# Patient Record
Sex: Female | Born: 1960 | Race: White | Hispanic: No | Marital: Married | Smoking: Never smoker
Health system: Southern US, Community
[De-identification: ages and names within clinical notes are randomized; demographics above are authoritative.]

## PROBLEM LIST (undated history)

## (undated) DIAGNOSIS — N6009 Solitary cyst of unspecified breast: Secondary | ICD-10-CM

## (undated) DIAGNOSIS — N63 Unspecified lump in unspecified breast: Secondary | ICD-10-CM

## (undated) DIAGNOSIS — J302 Other seasonal allergic rhinitis: Secondary | ICD-10-CM

## (undated) DIAGNOSIS — M51379 Other intervertebral disc degeneration, lumbosacral region without mention of lumbar back pain or lower extremity pain: Secondary | ICD-10-CM

## (undated) DIAGNOSIS — N6019 Diffuse cystic mastopathy of unspecified breast: Secondary | ICD-10-CM

## (undated) DIAGNOSIS — N211 Calculus in urethra: Secondary | ICD-10-CM

## (undated) DIAGNOSIS — N3010 Interstitial cystitis (chronic) without hematuria: Secondary | ICD-10-CM

## (undated) DIAGNOSIS — A048 Other specified bacterial intestinal infections: Secondary | ICD-10-CM

## (undated) DIAGNOSIS — K120 Recurrent oral aphthae: Secondary | ICD-10-CM

## (undated) DIAGNOSIS — K3189 Other diseases of stomach and duodenum: Secondary | ICD-10-CM

## (undated) DIAGNOSIS — M1990 Unspecified osteoarthritis, unspecified site: Secondary | ICD-10-CM

## (undated) DIAGNOSIS — N83202 Unspecified ovarian cyst, left side: Secondary | ICD-10-CM

## (undated) DIAGNOSIS — M76899 Other specified enthesopathies of unspecified lower limb, excluding foot: Secondary | ICD-10-CM

## (undated) DIAGNOSIS — R1013 Epigastric pain: Secondary | ICD-10-CM

## (undated) DIAGNOSIS — K219 Gastro-esophageal reflux disease without esophagitis: Secondary | ICD-10-CM

## (undated) DIAGNOSIS — M5137 Other intervertebral disc degeneration, lumbosacral region: Secondary | ICD-10-CM

## (undated) HISTORY — DX: Solitary cyst of unspecified breast: N60.09

## (undated) HISTORY — DX: Other intervertebral disc degeneration, lumbosacral region: M51.37

## (undated) HISTORY — DX: Diffuse cystic mastopathy of unspecified breast: N60.19

## (undated) HISTORY — DX: Epigastric pain: R10.13

## (undated) HISTORY — PX: OTHER SURGICAL HISTORY: SHX169

## (undated) HISTORY — PX: EYE SURGERY: SHX253

## (undated) HISTORY — DX: Unspecified ovarian cyst, left side: N83.202

## (undated) HISTORY — DX: Other seasonal allergic rhinitis: J30.2

## (undated) HISTORY — PX: TUBAL LIGATION: SHX77

## (undated) HISTORY — DX: Unspecified osteoarthritis, unspecified site: M19.90

## (undated) HISTORY — DX: Interstitial cystitis (chronic) without hematuria: N30.10

## (undated) HISTORY — DX: Other specified bacterial intestinal infections: A04.8

## (undated) HISTORY — DX: Other intervertebral disc degeneration, lumbosacral region without mention of lumbar back pain or lower extremity pain: M51.379

## (undated) HISTORY — PX: CHOLECYSTECTOMY: SHX55

## (undated) HISTORY — DX: Unspecified lump in unspecified breast: N63.0

## (undated) HISTORY — DX: Calculus in urethra: N21.1

## (undated) HISTORY — DX: Recurrent oral aphthae: K12.0

## (undated) HISTORY — DX: Other specified enthesopathies of unspecified lower limb, excluding foot: M76.899

## (undated) HISTORY — DX: Gastro-esophageal reflux disease without esophagitis: K21.9

## (undated) HISTORY — PX: NASAL SEPTUM SURGERY: SHX37

## (undated) HISTORY — DX: Other diseases of stomach and duodenum: K31.89

---
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
## 1994-08-08 HISTORY — PX: BREAST BIOPSY: SHX20

## 1997-02-05 HISTORY — PX: LAPAROSCOPY: SHX197

## 1997-08-08 HISTORY — PX: ABDOMINAL HYSTERECTOMY: SHX81

## 1998-08-08 HISTORY — PX: UPPER GI ENDOSCOPY: SHX6162

## 2000-10-06 HISTORY — PX: BREAST CYST ASPIRATION: SHX578

## 2001-05-16 ENCOUNTER — Other Ambulatory Visit: Admission: RE | Admit: 2001-05-16 | Discharge: 2001-05-16 | Payer: Self-pay | Admitting: Family Medicine

## 2002-11-07 HISTORY — PX: OVARIAN CYST SURGERY: SHX726

## 2003-06-23 ENCOUNTER — Encounter: Payer: Self-pay | Admitting: Internal Medicine

## 2003-08-09 HISTORY — PX: URETHRAL DILATION: SUR417

## 2004-07-30 ENCOUNTER — Ambulatory Visit: Payer: Self-pay | Admitting: Family Medicine

## 2004-08-08 HISTORY — PX: OTHER SURGICAL HISTORY: SHX169

## 2004-08-13 ENCOUNTER — Ambulatory Visit: Payer: Self-pay | Admitting: Family Medicine

## 2004-11-08 ENCOUNTER — Ambulatory Visit (HOSPITAL_BASED_OUTPATIENT_CLINIC_OR_DEPARTMENT_OTHER): Admission: RE | Admit: 2004-11-08 | Discharge: 2004-11-08 | Payer: Self-pay | Admitting: Urology

## 2004-12-21 ENCOUNTER — Ambulatory Visit: Payer: Self-pay | Admitting: Family Medicine

## 2005-02-01 ENCOUNTER — Ambulatory Visit: Payer: Self-pay | Admitting: Family Medicine

## 2005-02-03 ENCOUNTER — Ambulatory Visit: Payer: Self-pay | Admitting: General Surgery

## 2005-02-17 ENCOUNTER — Ambulatory Visit: Payer: Self-pay | Admitting: Family Medicine

## 2005-03-04 ENCOUNTER — Ambulatory Visit: Payer: Self-pay | Admitting: Family Medicine

## 2005-04-05 ENCOUNTER — Ambulatory Visit: Payer: Self-pay | Admitting: Family Medicine

## 2005-08-05 ENCOUNTER — Ambulatory Visit: Payer: Self-pay | Admitting: Unknown Physician Specialty

## 2005-08-12 ENCOUNTER — Emergency Department: Payer: Self-pay | Admitting: Emergency Medicine

## 2005-12-14 ENCOUNTER — Ambulatory Visit: Payer: Self-pay | Admitting: General Surgery

## 2005-12-22 ENCOUNTER — Ambulatory Visit: Payer: Self-pay | Admitting: Family Medicine

## 2006-02-10 ENCOUNTER — Ambulatory Visit: Payer: Self-pay | Admitting: Family Medicine

## 2006-02-10 ENCOUNTER — Other Ambulatory Visit: Admission: RE | Admit: 2006-02-10 | Discharge: 2006-02-10 | Payer: Self-pay | Admitting: Family Medicine

## 2006-02-10 ENCOUNTER — Encounter: Payer: Self-pay | Admitting: Family Medicine

## 2006-02-10 LAB — CONVERTED CEMR LAB: Pap Smear: NORMAL

## 2006-05-04 ENCOUNTER — Ambulatory Visit: Payer: Self-pay | Admitting: Family Medicine

## 2006-09-13 ENCOUNTER — Ambulatory Visit: Payer: Self-pay | Admitting: Family Medicine

## 2006-10-09 ENCOUNTER — Ambulatory Visit: Payer: Self-pay | Admitting: Family Medicine

## 2006-11-22 ENCOUNTER — Ambulatory Visit: Payer: Self-pay | Admitting: Family Medicine

## 2006-12-15 ENCOUNTER — Ambulatory Visit: Payer: Self-pay | Admitting: General Surgery

## 2007-03-09 ENCOUNTER — Encounter: Payer: Self-pay | Admitting: Family Medicine

## 2007-03-09 DIAGNOSIS — M5137 Other intervertebral disc degeneration, lumbosacral region: Secondary | ICD-10-CM

## 2007-03-09 DIAGNOSIS — Z8619 Personal history of other infectious and parasitic diseases: Secondary | ICD-10-CM | POA: Insufficient documentation

## 2007-03-09 DIAGNOSIS — M76899 Other specified enthesopathies of unspecified lower limb, excluding foot: Secondary | ICD-10-CM | POA: Insufficient documentation

## 2007-03-09 DIAGNOSIS — K120 Recurrent oral aphthae: Secondary | ICD-10-CM

## 2007-03-09 DIAGNOSIS — R32 Unspecified urinary incontinence: Secondary | ICD-10-CM

## 2007-03-09 DIAGNOSIS — N211 Calculus in urethra: Secondary | ICD-10-CM

## 2007-03-09 DIAGNOSIS — K121 Other forms of stomatitis: Secondary | ICD-10-CM | POA: Insufficient documentation

## 2007-04-03 ENCOUNTER — Ambulatory Visit: Payer: Self-pay | Admitting: Family Medicine

## 2007-04-12 ENCOUNTER — Encounter: Payer: Self-pay | Admitting: Family Medicine

## 2007-04-26 ENCOUNTER — Ambulatory Visit: Payer: Self-pay | Admitting: Family Medicine

## 2007-04-26 LAB — CONVERTED CEMR LAB
Casts: 0 /lpf
Ketones, urine, test strip: NEGATIVE
Nitrite: NEGATIVE
Specific Gravity, Urine: 1.025
Urine crystals, microscopic: 0 /hpf
Yeast, UA: 0
pH: 6

## 2007-05-10 ENCOUNTER — Ambulatory Visit: Payer: Self-pay | Admitting: Family Medicine

## 2007-05-10 LAB — CONVERTED CEMR LAB
Bilirubin Urine: NEGATIVE
Glucose, Urine, Semiquant: NEGATIVE
Specific Gravity, Urine: <1.005
WBC Urine, dipstick: NEGATIVE
pH: 7.5

## 2007-06-26 ENCOUNTER — Encounter: Payer: Self-pay | Admitting: Family Medicine

## 2007-06-28 ENCOUNTER — Encounter: Payer: Self-pay | Admitting: Family Medicine

## 2007-08-30 ENCOUNTER — Ambulatory Visit: Payer: Self-pay | Admitting: Family Medicine

## 2007-09-03 ENCOUNTER — Ambulatory Visit: Payer: Self-pay | Admitting: Gastroenterology

## 2007-09-06 ENCOUNTER — Ambulatory Visit (HOSPITAL_COMMUNITY): Admission: RE | Admit: 2007-09-06 | Discharge: 2007-09-06 | Payer: Self-pay | Admitting: Gastroenterology

## 2007-09-18 ENCOUNTER — Ambulatory Visit (HOSPITAL_COMMUNITY): Admission: RE | Admit: 2007-09-18 | Discharge: 2007-09-18 | Payer: Self-pay | Admitting: Gastroenterology

## 2007-10-01 ENCOUNTER — Ambulatory Visit: Payer: Self-pay | Admitting: Family Medicine

## 2007-10-07 HISTORY — PX: CHOLECYSTECTOMY: SHX55

## 2007-11-02 ENCOUNTER — Ambulatory Visit (HOSPITAL_COMMUNITY): Admission: RE | Admit: 2007-11-02 | Discharge: 2007-11-02 | Payer: Self-pay | Admitting: Surgery

## 2007-11-02 ENCOUNTER — Encounter (INDEPENDENT_AMBULATORY_CARE_PROVIDER_SITE_OTHER): Payer: Self-pay | Admitting: Surgery

## 2007-12-18 ENCOUNTER — Ambulatory Visit: Payer: Self-pay | Admitting: General Surgery

## 2008-01-01 ENCOUNTER — Encounter: Payer: Self-pay | Admitting: Family Medicine

## 2008-02-21 ENCOUNTER — Telehealth (INDEPENDENT_AMBULATORY_CARE_PROVIDER_SITE_OTHER): Payer: Self-pay | Admitting: *Deleted

## 2008-06-04 ENCOUNTER — Ambulatory Visit: Payer: Self-pay | Admitting: Family Medicine

## 2008-06-04 ENCOUNTER — Encounter (INDEPENDENT_AMBULATORY_CARE_PROVIDER_SITE_OTHER): Payer: Self-pay | Admitting: Internal Medicine

## 2008-06-16 ENCOUNTER — Telehealth (INDEPENDENT_AMBULATORY_CARE_PROVIDER_SITE_OTHER): Payer: Self-pay | Admitting: Internal Medicine

## 2008-06-17 ENCOUNTER — Telehealth (INDEPENDENT_AMBULATORY_CARE_PROVIDER_SITE_OTHER): Payer: Self-pay | Admitting: Internal Medicine

## 2008-06-18 ENCOUNTER — Telehealth: Payer: Self-pay | Admitting: Gastroenterology

## 2008-06-20 ENCOUNTER — Telehealth (INDEPENDENT_AMBULATORY_CARE_PROVIDER_SITE_OTHER): Payer: Self-pay | Admitting: *Deleted

## 2008-06-23 ENCOUNTER — Ambulatory Visit: Payer: Self-pay | Admitting: Internal Medicine

## 2008-06-23 DIAGNOSIS — R1013 Epigastric pain: Secondary | ICD-10-CM

## 2008-06-23 DIAGNOSIS — K3189 Other diseases of stomach and duodenum: Secondary | ICD-10-CM

## 2008-06-23 DIAGNOSIS — K219 Gastro-esophageal reflux disease without esophagitis: Secondary | ICD-10-CM

## 2008-06-30 ENCOUNTER — Encounter: Payer: Self-pay | Admitting: Family Medicine

## 2008-06-30 ENCOUNTER — Ambulatory Visit: Payer: Self-pay | Admitting: Family Medicine

## 2008-06-30 ENCOUNTER — Other Ambulatory Visit: Admission: RE | Admit: 2008-06-30 | Discharge: 2008-06-30 | Payer: Self-pay | Admitting: Family Medicine

## 2008-06-30 DIAGNOSIS — N6009 Solitary cyst of unspecified breast: Secondary | ICD-10-CM

## 2008-07-08 LAB — CONVERTED CEMR LAB
ALT: 13 units/L (ref 0–35)
AST: 15 units/L (ref 0–37)
Albumin: 4.2 g/dL (ref 3.5–5.2)
Alkaline Phosphatase: 31 units/L — ABNORMAL LOW (ref 39–117)
BUN: 12 mg/dL (ref 6–23)
BUN: 9 mg/dL (ref 6–23)
Basophils Relative: 0.7 % (ref 0.0–1.0)
Bilirubin, Direct: 0.2 mg/dL (ref 0.0–0.3)
CO2: 29 meq/L (ref 19–32)
Calcium: 9.2 mg/dL (ref 8.4–10.5)
Calcium: 9.4 mg/dL (ref 8.4–10.5)
Chloride: 103 meq/L (ref 96–112)
Chloride: 104 meq/L (ref 96–112)
Creatinine, Ser: 0.6 mg/dL (ref 0.4–1.2)
Eosinophils Absolute: 0 10*3/uL (ref 0.0–0.6)
Eosinophils Relative: 0.5 % (ref 0.0–5.0)
GFR calc Af Amer: 117 mL/min
GFR calc Af Amer: 139 mL/min
GFR calc non Af Amer: 115 mL/min
GFR calc non Af Amer: 97 mL/min
Glucose, Bld: 82 mg/dL (ref 70–99)
Glucose, Bld: 95 mg/dL (ref 70–99)
Lipase: 21 units/L (ref 11.0–59.0)
Monocytes Relative: 5.9 % (ref 3.0–11.0)
Neutro Abs: 4.3 10*3/uL (ref 1.4–7.7)
Platelets: 217 10*3/uL (ref 150–400)
Potassium: 4.2 meq/L (ref 3.5–5.1)
RBC: 4.28 M/uL (ref 3.87–5.11)
WBC: 6.4 10*3/uL (ref 4.5–10.5)

## 2008-07-10 ENCOUNTER — Ambulatory Visit (HOSPITAL_COMMUNITY): Admission: RE | Admit: 2008-07-10 | Discharge: 2008-07-10 | Payer: Self-pay | Admitting: Gastroenterology

## 2008-07-11 ENCOUNTER — Ambulatory Visit: Payer: Self-pay | Admitting: Gastroenterology

## 2008-07-11 ENCOUNTER — Encounter: Payer: Self-pay | Admitting: Family Medicine

## 2008-07-13 LAB — CONVERTED CEMR LAB
Albumin: 3.8 g/dL (ref 3.5–5.2)
Total Bilirubin: 0.4 mg/dL (ref 0.3–1.2)

## 2008-07-14 ENCOUNTER — Ambulatory Visit: Payer: Self-pay | Admitting: Gastroenterology

## 2008-07-24 ENCOUNTER — Ambulatory Visit: Payer: Self-pay | Admitting: Gastroenterology

## 2008-07-24 ENCOUNTER — Ambulatory Visit (HOSPITAL_COMMUNITY): Admission: RE | Admit: 2008-07-24 | Discharge: 2008-07-24 | Payer: Self-pay | Admitting: Gastroenterology

## 2008-08-20 ENCOUNTER — Ambulatory Visit: Payer: Self-pay | Admitting: Gastroenterology

## 2008-08-27 ENCOUNTER — Telehealth: Payer: Self-pay | Admitting: Gastroenterology

## 2008-08-28 ENCOUNTER — Ambulatory Visit: Payer: Self-pay | Admitting: Family Medicine

## 2008-08-29 ENCOUNTER — Telehealth (INDEPENDENT_AMBULATORY_CARE_PROVIDER_SITE_OTHER): Payer: Self-pay | Admitting: Internal Medicine

## 2008-09-01 ENCOUNTER — Encounter: Payer: Self-pay | Admitting: Gastroenterology

## 2008-10-08 ENCOUNTER — Encounter: Payer: Self-pay | Admitting: Family Medicine

## 2008-10-13 ENCOUNTER — Telehealth: Payer: Self-pay | Admitting: Gastroenterology

## 2008-12-18 ENCOUNTER — Encounter: Payer: Self-pay | Admitting: Family Medicine

## 2008-12-18 ENCOUNTER — Ambulatory Visit: Payer: Self-pay | Admitting: General Surgery

## 2009-01-14 ENCOUNTER — Ambulatory Visit: Payer: Self-pay | Admitting: Family Medicine

## 2009-01-14 DIAGNOSIS — R519 Headache, unspecified: Secondary | ICD-10-CM | POA: Insufficient documentation

## 2009-01-14 DIAGNOSIS — R51 Headache: Secondary | ICD-10-CM

## 2009-02-18 ENCOUNTER — Encounter (INDEPENDENT_AMBULATORY_CARE_PROVIDER_SITE_OTHER): Payer: Self-pay | Admitting: Internal Medicine

## 2009-06-29 ENCOUNTER — Encounter (INDEPENDENT_AMBULATORY_CARE_PROVIDER_SITE_OTHER): Payer: Self-pay | Admitting: Internal Medicine

## 2009-10-28 IMAGING — US US ABDOMEN COMPLETE
1 series · 14 of 25 positions shown · non-contrast
Comparison: Abdominal ultrasound 09/06/2007 and intraoperative
cholangiogram 11/02/2007.

CLINICAL DATA: Abnormal liver function tests.

ABDOMEN ULTRASOUND
TECHNIQUE: Complete abdominal ultrasound examination was performed
including evaluation of the liver, gallbladder, bile ducts,
pancreas, kidneys, spleen, IVC, and abdominal aorta.

[Series 1: unknown · 0.28mm/px · 14 of 47 slices shown]
[im 1/47]
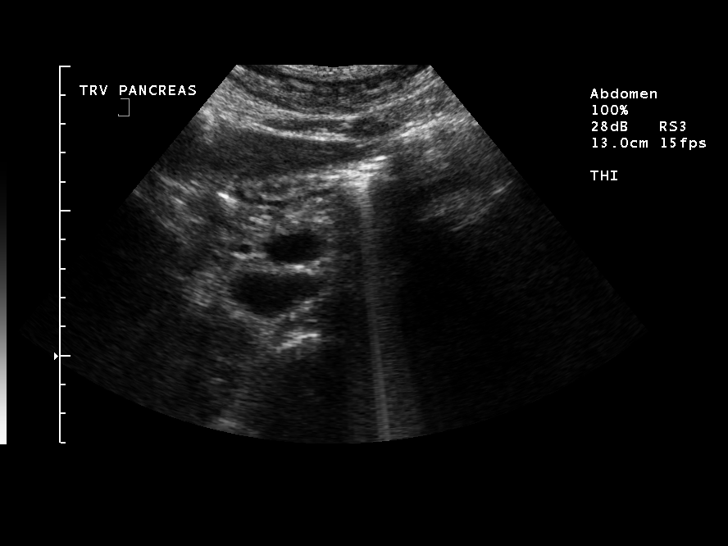
[im 4/47]
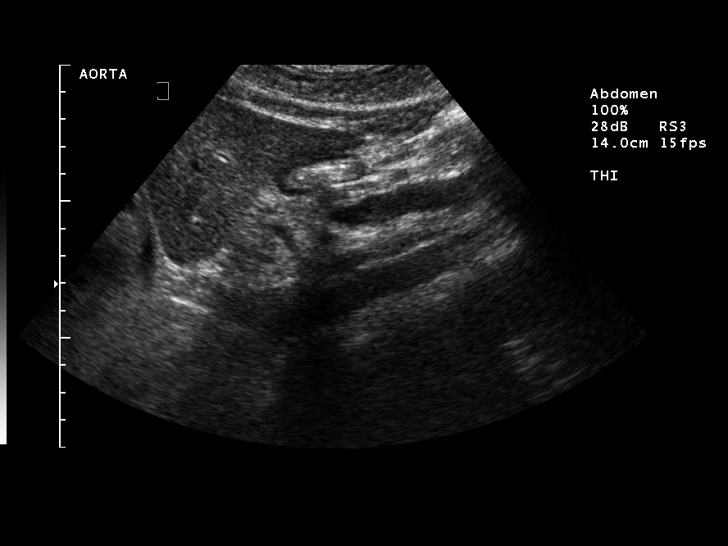
[im 8/47]
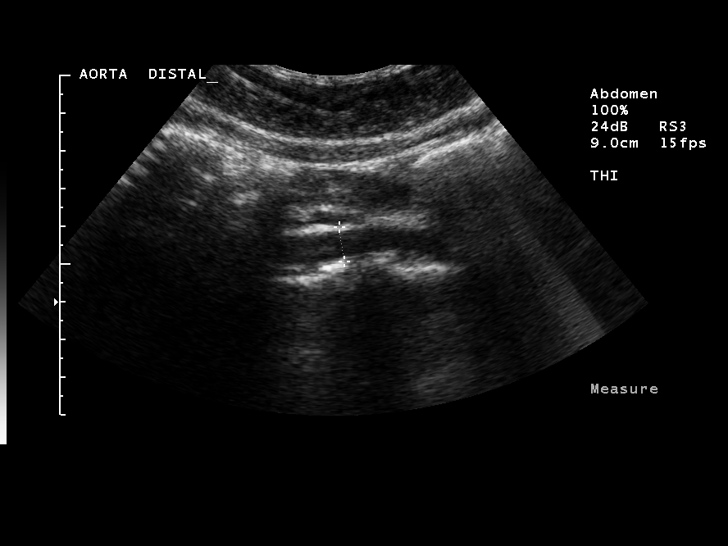
[im 12/47]
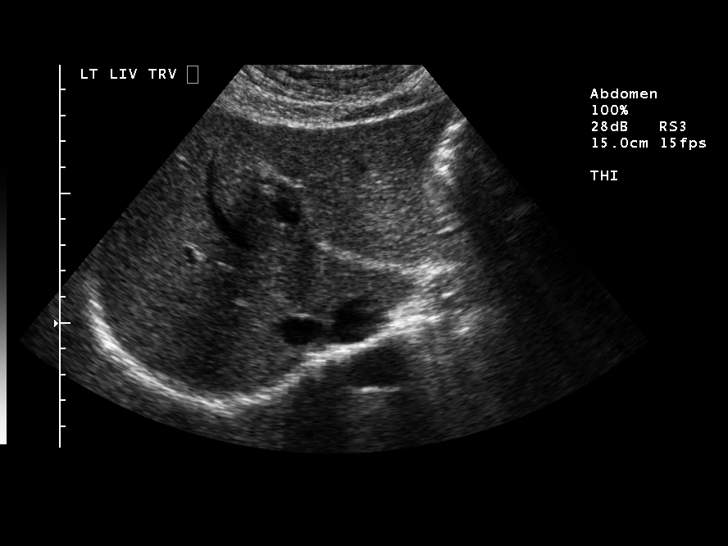
[im 16/47]
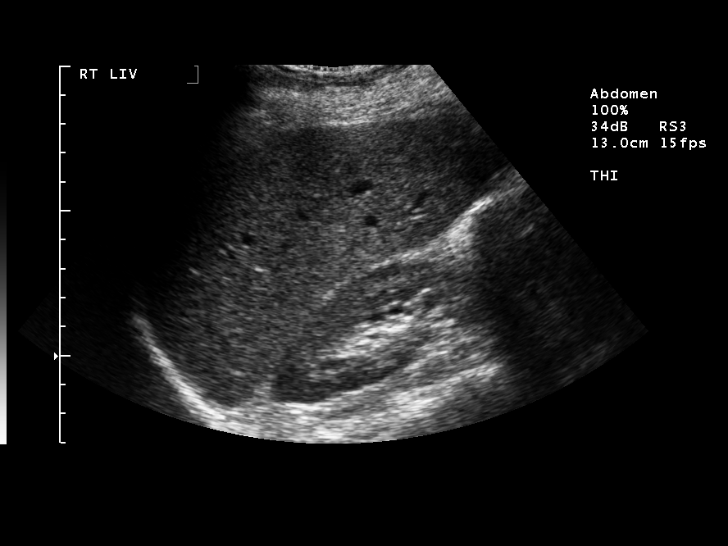
[im 18/47]
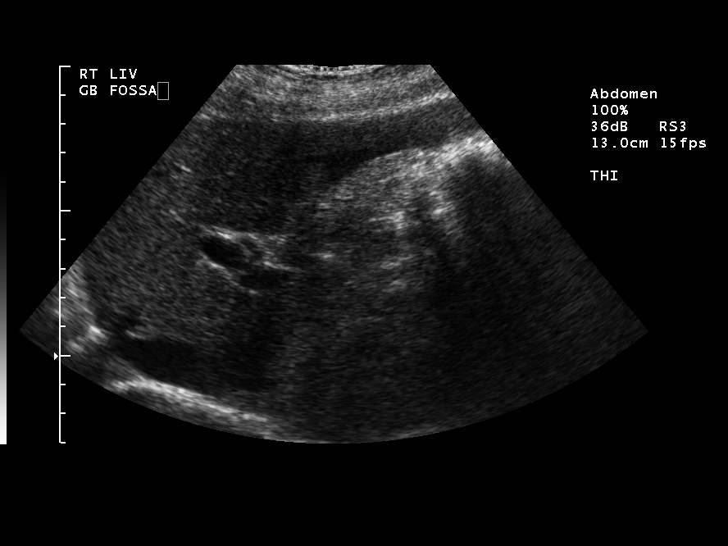
[im 22/47]
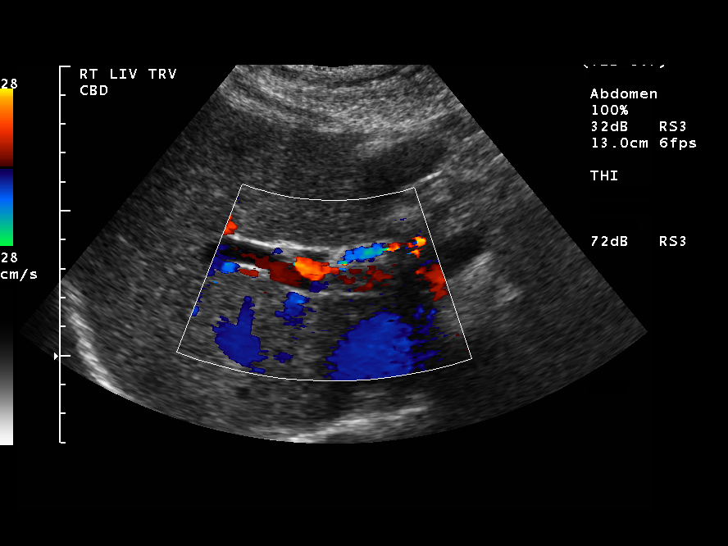
[im 25/47]
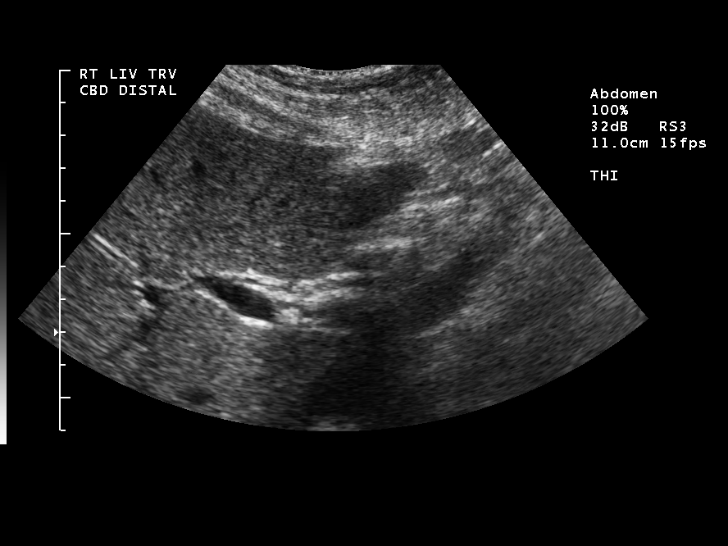
[im 29/47]
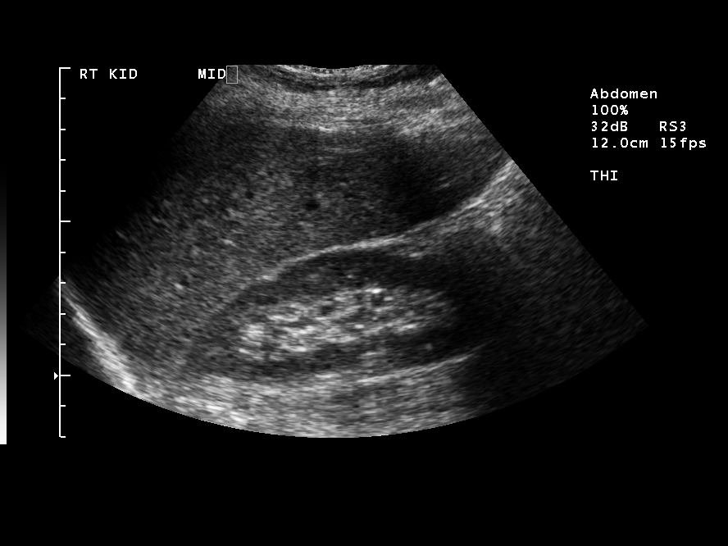
[im 31/47]
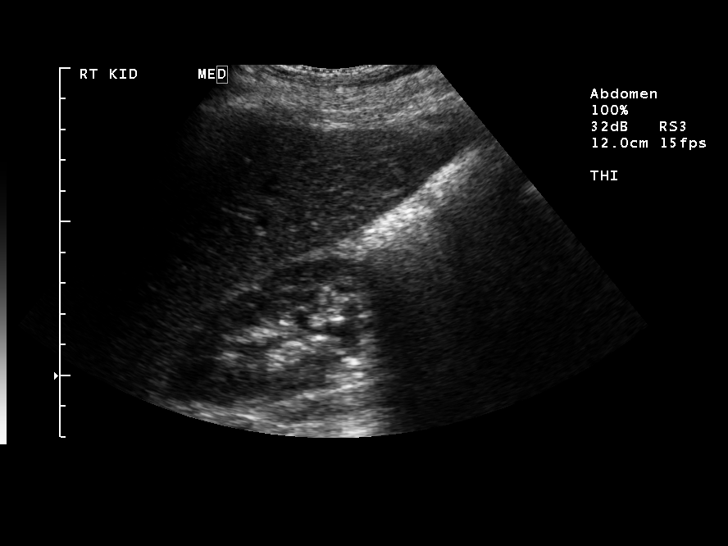
[im 35/47]
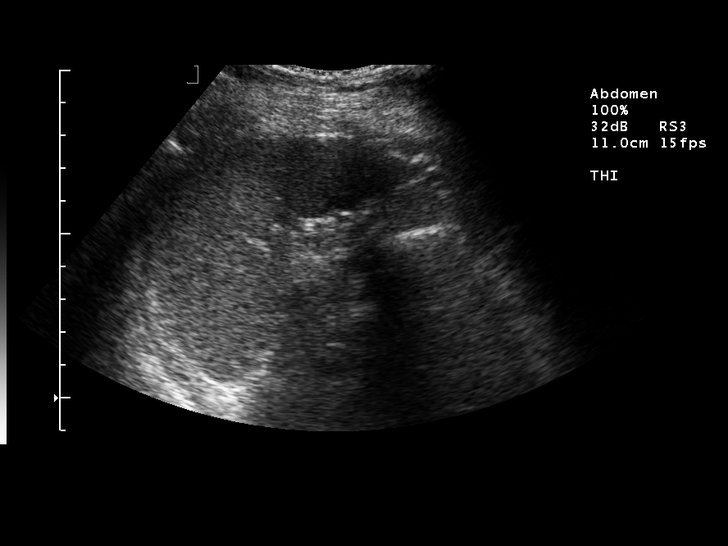
[im 39/47]
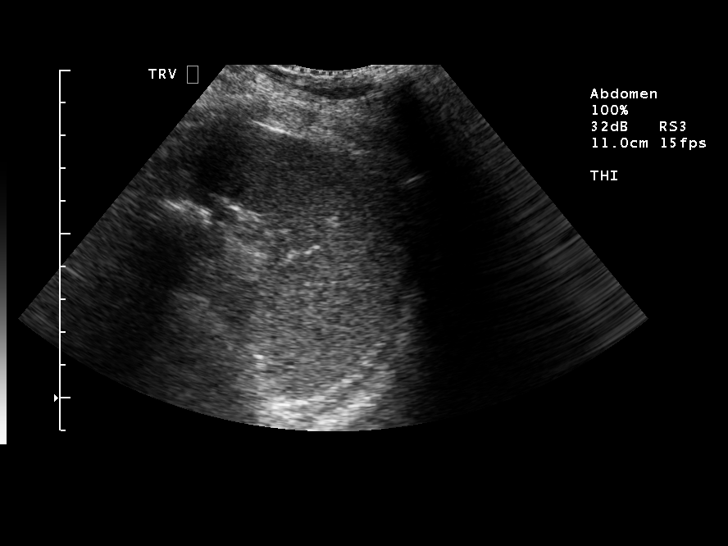
[im 43/47]
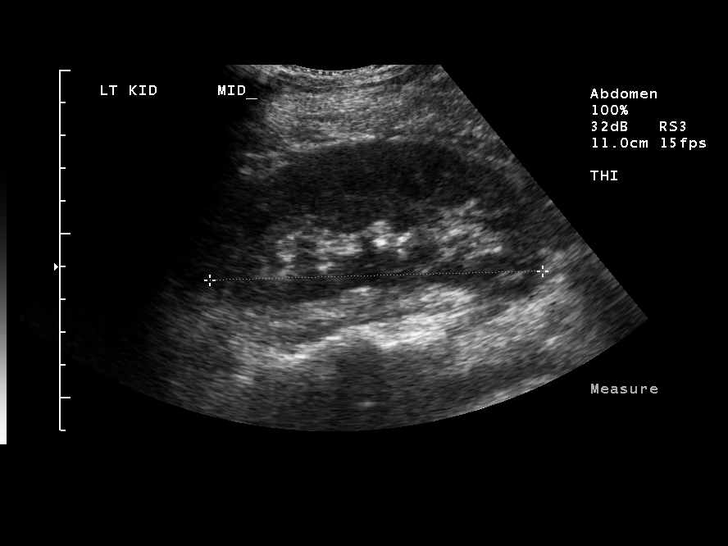
[im 47/47]
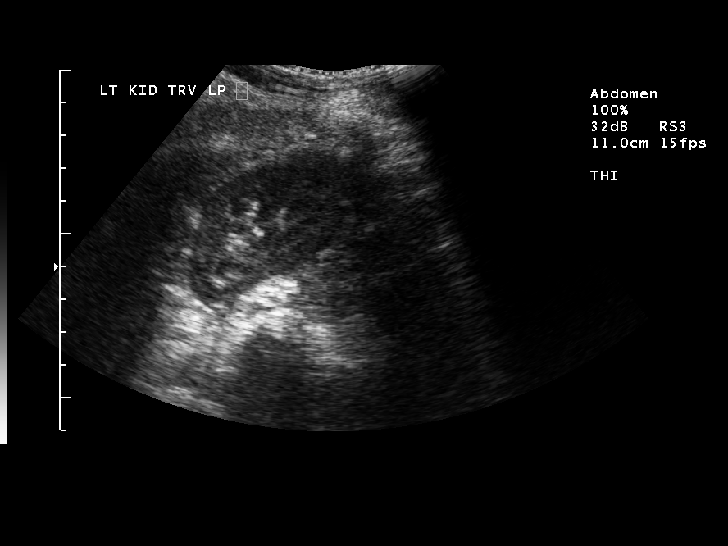

[14 of 25 positions shown; findings below may reference images not displayed]

FINDINGS: The gallbladder is surgically absent.  There is no
biliary dilatation or fluid collection in the cholecystectomy bed.
The liver is normal in echogenicity and without focal abnormality.
The visualized portions of the spleen, pancreas, IVC and aorta
appear normal.

Both kidneys appear normal, measuring 10.6 cm in length on the
right and 10.1 cm in length on the left.  There is no ascites.
IMPRESSION: Normal abdominal ultrasound status post cholecystectomy.

## 2009-12-23 ENCOUNTER — Encounter: Payer: Self-pay | Admitting: Family Medicine

## 2009-12-31 ENCOUNTER — Ambulatory Visit: Payer: Self-pay | Admitting: General Surgery

## 2009-12-31 ENCOUNTER — Encounter: Payer: Self-pay | Admitting: Family Medicine

## 2010-01-01 ENCOUNTER — Encounter: Payer: Self-pay | Admitting: Family Medicine

## 2010-01-07 ENCOUNTER — Encounter: Payer: Self-pay | Admitting: Family Medicine

## 2010-03-09 ENCOUNTER — Ambulatory Visit: Payer: Self-pay | Admitting: Family Medicine

## 2010-03-09 ENCOUNTER — Telehealth (INDEPENDENT_AMBULATORY_CARE_PROVIDER_SITE_OTHER): Payer: Self-pay | Admitting: *Deleted

## 2010-03-16 ENCOUNTER — Ambulatory Visit: Payer: Self-pay | Admitting: Family Medicine

## 2010-03-16 DIAGNOSIS — N6019 Diffuse cystic mastopathy of unspecified breast: Secondary | ICD-10-CM

## 2010-08-19 ENCOUNTER — Encounter: Payer: Self-pay | Admitting: Family Medicine

## 2010-09-09 NOTE — Progress Notes (Signed)
----   Converted from flag ---- ---- 03/08/2010 9:47 PM, Colon Flattery Tower MD wrote: please check fasting lipid and wellness v70.0- thanks  ---- 03/08/2010 12:35 PM, Liane Comber CMA (AAMA) wrote: Pt is scheduled for cpx labs tomorrow, what labs to draw and dx codes? Thanks Tasha ------------------------------

## 2010-09-09 NOTE — Miscellaneous (Signed)
Summary: flu vaccine   Clinical Lists Changes  Observations: Added new observation of FLU VAX: Historical received at [facility] (06/23/2010 9:40)      Immunization History:  Influenza Immunization History:    Influenza:  historical received at [facility] (06/23/2010)

## 2010-09-09 NOTE — Letter (Signed)
Summary: [facility]   Imported By: Lanelle Bal 01/12/2010 10:18:56  _____________________________________________________________________  External Attachment:    Type:   Image     Comment:   External Document
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
## 2010-09-09 NOTE — Letter (Signed)
Summary: [facility]  [facility]   Imported By: Lanelle Bal 01/19/2010 10:25:38  _____________________________________________________________________  External Attachment:    Type:   Image     Comment:   External Document

## 2010-09-09 NOTE — Letter (Signed)
Summary: [facility]  [facility]   Imported By: Lanelle Bal 12/30/2009 10:53:25  _____________________________________________________________________  External Attachment:    Type:   Image     Comment:   External Document

## 2010-09-20 ENCOUNTER — Ambulatory Visit (INDEPENDENT_AMBULATORY_CARE_PROVIDER_SITE_OTHER): Payer: BC Managed Care – PPO | Admitting: Family Medicine

## 2010-09-20 ENCOUNTER — Encounter: Payer: Self-pay | Admitting: Family Medicine

## 2010-09-20 DIAGNOSIS — J019 Acute sinusitis, unspecified: Secondary | ICD-10-CM

## 2010-09-21 ENCOUNTER — Ambulatory Visit: Payer: Self-pay | Admitting: Family Medicine

## 2010-09-21 ENCOUNTER — Telehealth: Payer: Self-pay | Admitting: Family Medicine

## 2010-09-29 NOTE — Progress Notes (Signed)
Summary: prior Berkley Harvey will be needed for nasonex  Phone Note From Pharmacy   Caller: [facility] Dr[facility]land Kitchen Summary of Call: Prior auth will be needed for nasonex, or pharmacy is suggesting to change to flonase.  Do you want to change?, or I can call for prior auth form.                 Lowella Petties CMA, AAMA  September 21, 2010 8:07 AM  Pt came to office, she doesnt want to wait for prior auth, [facility].  She says her nose is hurting. Initial call taken by: Lowella Petties CMA, AAMA,  September 21, 2010 11:00 AM  Follow-up for Phone Call        ok- will change to flonase  Follow-up by: Judith Part MD,  September 21, 2010 12:03 PM  Additional Follow-up for Phone Call Additional follow up Details #1::        [facility], advised pt.              Lowella Petties CMA, AAMA  September 21, 2010 12:22 PM     New/Updated Medications: FLONASE 50 MCG/ACT SUSP (FLUTICASONE PROPIONATE) 2 sprays in each nostril once daily Prescriptions: FLONASE 50 MCG/ACT SUSP (FLUTICASONE PROPIONATE) 2 sprays in each nostril once daily  #1 mdi x 11   Entered and Authorized by:   Judith Part MD   Signed by:   Judith Part MD on 09/21/2010   Method used:   Telephoned to ...       [facility] DrMarland Kitchen (retail)       [address]       [place], [place]  [address]       Ph: [phone]       Fax: [phone]   RxID:   [id number]
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
## 2010-12-14 ENCOUNTER — Ambulatory Visit: Payer: Self-pay | Admitting: Sports Medicine

## 2010-12-21 NOTE — Op Note (Signed)
Robin Henson, Robin Henson                ACCOUNT NO.:  [id number]   MEDICAL RECORD NO.:  [id number]          PATIENT TYPE:  AMB   LOCATION:  DAY                          FACILITY:  [facility]   PHYSICIAN:  Wilmon Arms. Corliss Skains, M.D. DATE OF BIRTH:  Jun 10, 1961   DATE OF PROCEDURE:  11/02/2007  DATE OF DISCHARGE:                               OPERATIVE REPORT   PREOPERATIVE DIAGNOSIS:  Biliary dyskinesia.   POSTOPERATIVE DIAGNOSIS:  Biliary dyskinesia.   PROCEDURE PERFORMED:  Laparoscopic cholecystectomy with intraoperative  cholangiogram.   SURGEON:  Wilmon Arms. Tsuei, M.D., FACS   ANESTHESIA:  General endotracheal.   INDICATIONS:  The patient is a 50 year old female who presents with a 5  day history of intermittent right upper quadrant pain tending to be  postprandial after eating greasy food.  Workup had included an  ultrasound which showed no sign of gallstones.  A HIDA scan was recently  obtained which showed a decreased gallbladder ejection fraction of only  21%.  She is scheduled now for elective cholecystectomy.   DESCRIPTION OF PROCEDURE:  The patient was brought to the operating room  and placed in the supine position on the operating room table.  After an  adequate level of general anesthesia was obtained the patient's abdomen  was prepped with Betadine and draped in a sterile fashion.  Please note  that due to the patient's preexisting elbow condition she was positioned  with her right arm across her chest.  Appropriate padding was placed.  We infiltrated the area just below her umbilicus with quarter percent  Marcaine with epinephrine.  She had a previous transverse laparoscopic  incision which we opened with a scalpel.  Dissection was carried down to  the fascia which was grasped with Kocher clamps and opened vertically.  The peritoneal cavity was bluntly entered.  A stay suture of zero Vicryl  was placed around the fascial opening.  Pneumoperitoneum was obtained by  insufflating  CO2 maintaining a maximum pressure of 15 mmHg.  An 11 mm  port was placed in subxiphoid position.  Two 5 mm ports were placed in  the right upper quadrant.  The laparoscope was inserted and the patient  was positioned in reverse Trendelenburg and tilted to her left.  An 11  mm port was placed in the subxiphoid position.  Two 5 mL ports were  placed in the right upper quadrant.  The gallbladder was grasped with a  clamp and elevated over the edge of the liver.  We opened the peritoneum  around the hilum of the gallbladder.  The duodenum was bluntly dissected  away from the gallbladder.  We identified a very diminutive cystic duct.  We could clearly see the junction with the gallbladder as well as the  junction of the cystic duct with the common bile duct.  Being careful to  stay close to the gallbladder we circumferentially dissected around the  cystic duct.  A clip was placed proximally at the cystic  duct/gallbladder junction.  A small opening was created on the cystic  duct.  A Cook cholangiogram catheter was inserted through  a stab  incision and threaded into the cystic duct.  A cholangiogram was  obtained which showed some remaining cystic duct stump and contrast  flowed easily into the common bile duct with no sign of obstruction or  leak.  The catheter was removed.  The cystic duct was ligated with clips  and divided.  The cystic artery was ligated with clips and divided.  Cautery was then used to remove the gallbladder from the liver bed.  The  gallbladder was fairly intrahepatic in location and was rather difficult  to remove with cautery.  Two small holes were made in the gallbladder  and some bile was spilled but no stones were noted.  The gallbladder was  placed in an EndoCatch sac and removed through the umbilical port site.  We carefully inspected the gallbladder fossa for hemostasis.  Hemostasis  was obtained with cautery.  We suctioned out as much irrigation as  possible.   The gallbladder was then removed through the umbilical port  site.  Pneumoperitoneum was released as the trocars were removed.  The  stay suture was used to close the umbilical fascia.  Monocryl 4-0 was  used to close the skin incisions.  Steri-Strips and clean dressings were  applied.  The patient was extubated and brought to recovery in stable  condition.  All sponge, instrument and needle counts were correct.      Wilmon Arms. Tsuei, M.D.  Electronically Signed     MKT/MEDQ  D:  11/02/2007  T:  11/03/2007  Job:  [id number]

## 2010-12-21 NOTE — Assessment & Plan Note (Signed)
[facility]                         GASTROENTEROLOGY OFFICE NOTE   Robin Henson, Robin Henson                       MRN:          [id number]  DATE:09/03/2007                            DOB:          20-Apr-1961    GI PROBLEM LIST:  Recurrent intermittent right upper quadrant pain.  EGD  by Dr. Stan Head November 2004 was negative.  He suspected functional  dyspepsia at that time.  Liver tests at that time were normal.  Abdominal ultrasound at that time was normal without stones.   INTERVAL HISTORY:  Robin Henson was last seen in 2004 when she was being  worked up by Dr. Leone Payor for right upper quadrant pain.  She says the  pain really got better over several years until the past few months when  she has had intermittent right upper quadrant pain.  She said the pain  is worse with certain types of food, specifically peanuts, green  peppers, onions.  She recently made some chili beans for herself and  that was particularly terrible.  The pain is in the right upper  quadrant, associated with some nausea.  No vomiting.  She does seem to  have rather constant pain now that is made worse by any food that she  has eaten lately.  She had a lot of belching as well.  She has had no  fevers or chills.   CURRENT MEDICATIONS:  1. Ibuprofen 2 to 4 pills a day.  2. Protonix she has just started a week ago.   PHYSICAL EXAM:  Height 5 feet 3 inches, weight 138 pounds, blood  pressure 108/66, pulse 68.  CONSTITUTIONAL:  Generally well-appearing.  LUNGS:  Clear to auscultation bilaterally.  HEART:  Regular rate and rhythm.  ABDOMEN:  Soft.  Mildly tender in the right upper quadrant and right  lateral ribs.  No focal tenderness on the ribs.  (She has had no trauma  that she can recall.)  Bowel sounds normal.  Nontender rest of abdomen.   ASSESSMENT AND PLAN:  A 50 year old woman with chronic intermittent  right upper quadrant pains.   This does sound biliary and  although she had no gall stones, normal  gallbladder in 2004, I would like to repeat that ultrasound now.  Will  also get a basic set of labs including comprehensive metabolic panel,  thyroid testing, and CBC.  If the ultrasound is normal, would probably  proceed with HIDA scan, which was the plan that Dr. Leone Payor had when he  saw her several years ago.    Rachael Fee, MD  Electronically Signed   DPJ/MedQ  DD: 09/03/2007  DT: 09/03/2007  Job #: [id number]   cc:   Billie D. Bean, FNP

## 2010-12-24 NOTE — Op Note (Signed)
NAMEALEXES, MENCHACA                ACCOUNT NO.:  [id number]   MEDICAL RECORD NO.:  [id number]          PATIENT TYPE:  AMB   LOCATION:  [facility]                         FACILITY:  [facility]   PHYSICIAN:  Excell Seltzer. Annabell Howells, M.D.    DATE OF BIRTH:  03-09-1961   DATE OF PROCEDURE:  11/08/2004  DATE OF DISCHARGE:                                 OPERATIVE REPORT   PROCEDURE:  Cystoscopy, hydrodistention of the bladder, urethral dilation,  installation of Pyridium and Marcaine.   PREOPERATIVE DIAGNOSES:  Painful bladder, rule out interstitial cystitis.   POSTOPERATIVE DIAGNOSES:  Painful bladder with urethral stricture.   SURGEON:  Excell Seltzer. Annabell Howells, M.D.   ANESTHESIA:  General.   COMPLICATIONS:  None.   INDICATIONS FOR PROCEDURE:  [name] is a 50 year old white female with a  painful bladder, she has had prior urethral dilations but based on her  symptoms I felt we needed to rule out interstitial cystitis.   FINDINGS AND PROCEDURE:  The patient was given p.o. Cipro, she was taken to  the operating room where a general anesthetic was induced. She was placed in  lithotomy position, her perineum and genitalia and were prepped with  Betadine solution and she was draped in the usual sterile fashion.  Cystoscopy was performed using the 22 [place] scope and 12 and 70 degree  lenses. Examination revealed a normal urethra. There were 1 or 2 small  benign polyps at the bladder neck. The bladder wall was smooth and pale  without tumor, stones or inflammation. The ureteral orifices were  unremarkable.   After completion of cystoscopy, an 51 French Foley catheter was inserted and  the balloon was filled with 10 mL of air. The bladder was then filled under  80 cm of water pressure to capacity, this was held for 5 minutes and the  bladder was then drained. Her capacity under anesthesia was 1200 mL, there  was absolutely no blood in the efflux after hydrodistention.   Repeat cystoscopy was performed, she had  a couple of glomerular hemorrhages  next to the right ureteral orifice but nothing suggestive of significant  interstitial cystitis changes.   At this point, the urethra was dilated. She was quite tight with a 24 [place]  sound, I dilated her up to 43 [place], I did not want to go further for fear  of trauma to the urethra. At this point, the bladder was drained and she had  30 mL of 0.25% Marcaine with 400 mg of Pyridium instilled. The sound was  removed.  She was taken down from the lithotomy position, her anesthetic was  reversed. She was moved to the recovery room in stable condition.      JJW/MEDQ  D:  11/08/2004  T:  11/08/2004  Job:  [id number]   cc:   Marne A. Milinda Antis, M.D. [facility]

## 2011-01-07 ENCOUNTER — Ambulatory Visit: Payer: Self-pay | Admitting: General Surgery

## 2011-06-13 HISTORY — PX: BREAST CYST ASPIRATION: SHX578

## 2011-06-19 ENCOUNTER — Telehealth: Payer: Self-pay | Admitting: Family Medicine

## 2011-06-19 DIAGNOSIS — Z0000 Encounter for general adult medical examination without abnormal findings: Secondary | ICD-10-CM | POA: Insufficient documentation

## 2011-06-19 NOTE — Telephone Encounter (Signed)
Message copied by Judy Pimple on Sun Jun 19, 2011  2:18 PM ------      Message from: [place], [place] J      Created: Wed Jun 15, 2011  4:21 PM      Regarding: labs for Mon 11-12       Patient is scheduled for CPX labs, please order future labs, Thanks , Camelia Eng

## 2011-06-20 ENCOUNTER — Other Ambulatory Visit (INDEPENDENT_AMBULATORY_CARE_PROVIDER_SITE_OTHER): Payer: BC Managed Care – PPO

## 2011-06-20 DIAGNOSIS — Z0000 Encounter for general adult medical examination without abnormal findings: Secondary | ICD-10-CM

## 2011-06-24 ENCOUNTER — Encounter: Payer: Self-pay | Admitting: Family Medicine

## 2011-06-24 ENCOUNTER — Observation Stay: Payer: Self-pay | Admitting: Internal Medicine

## 2011-06-24 DIAGNOSIS — R072 Precordial pain: Secondary | ICD-10-CM

## 2011-06-27 ENCOUNTER — Ambulatory Visit (INDEPENDENT_AMBULATORY_CARE_PROVIDER_SITE_OTHER): Payer: BC Managed Care – PPO | Admitting: Family Medicine

## 2011-06-27 ENCOUNTER — Other Ambulatory Visit (HOSPITAL_COMMUNITY)
Admission: RE | Admit: 2011-06-27 | Discharge: 2011-06-27 | Disposition: A | Discharge status: Home / Self Care | Payer: BC Managed Care – PPO | Source: Ambulatory Visit | Attending: Family Medicine | Admitting: Family Medicine

## 2011-06-27 ENCOUNTER — Encounter: Payer: Self-pay | Admitting: Family Medicine

## 2011-06-27 VITALS — BP 100/60 | HR 68 | Temp 97.7°F | Ht 63.0 in | Wt 143.2 lb

## 2011-06-27 DIAGNOSIS — Z01419 Encounter for gynecological examination (general) (routine) without abnormal findings: Secondary | ICD-10-CM | POA: Insufficient documentation

## 2011-06-27 DIAGNOSIS — Z0000 Encounter for general adult medical examination without abnormal findings: Secondary | ICD-10-CM

## 2011-06-27 DIAGNOSIS — Z1159 Encounter for screening for other viral diseases: Secondary | ICD-10-CM | POA: Insufficient documentation

## 2011-06-27 DIAGNOSIS — B373 Candidiasis of vulva and vagina: Secondary | ICD-10-CM

## 2011-06-27 DIAGNOSIS — R079 Chest pain, unspecified: Secondary | ICD-10-CM | POA: Insufficient documentation

## 2011-06-27 DIAGNOSIS — K219 Gastro-esophageal reflux disease without esophagitis: Secondary | ICD-10-CM

## 2011-06-27 LAB — POCT WET PREP (WET MOUNT)

## 2011-06-27 MED ORDER — FLUCONAZOLE 150 MG PO TABS: 150.0000 mg | ORAL_TABLET | Freq: Once | ORAL | Status: AC

## 2011-06-27 NOTE — Assessment & Plan Note (Signed)
Reviewed health habits including diet and exercise and skin cancer prevention Also reviewed health mt list, fam hx and immunizations  Rev labs from [facility] with great lipid profile
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
## 2011-06-27 NOTE — Assessment & Plan Note (Signed)
Yeast infx noted (minimal itch - not very symptomatic )  tx with diflucan and update  Enc consumption of yogurt for the probiotics also

## 2011-06-27 NOTE — Assessment & Plan Note (Signed)
Did 3 year pap today- but also had yeast infx - so may have been obscured If so will repeat pap next year Yeast was treated

## 2011-06-27 NOTE — Assessment & Plan Note (Signed)
Reviewed hospital records in detail - for atypical cp Agree is not likely cardiac  Suspect costochondritis and GERD both playing a role Will use heat/ see chiropractor for check Also nexium daily  F/u 2 mo or earlier if needed Disc red flags for cp to alert me of

## 2011-06-27 NOTE — Assessment & Plan Note (Signed)
This is worse Hx of treated h pylori in past  Changed to nexium daily -- just started that  Will udpate if not imp F/u 2 mo or earlier if needed  Could be adding to her cp

## 2011-06-27 NOTE — Patient Instructions (Addendum)
Labs look good Go ahead and follow up with chiropractor for back and mention chest pain Continue nexium  Follow up in about 2 months  In the meantime - if chest pain worsens - let me know  Use heat on your chest 10 minutes at a time  You have a yeast infection- take diflucan as directed I sent that to your pharmacy

## 2011-06-28 LAB — CBC WITH DIFFERENTIAL
Basophils Absolute: 0 10*3/uL (ref 0.0–0.2)
Eosinophils Absolute: 0.1 10*3/uL (ref 0.0–0.4)
HCT: 39.8 % (ref 34.0–46.6)
Lymphocytes Absolute: 1.7 10*3/uL (ref 0.7–4.5)
Lymphs: 34 % (ref 14–46)
MCHC: 34.4 g/dL (ref 31.5–35.7)
MCV: 92 fL (ref 79–97)
Neutrophils Absolute: 2.8 10*3/uL (ref 1.8–7.8)
Platelets: 249 10*3/uL (ref 140–415)
RDW: 13.2 % (ref 12.3–15.4)

## 2011-06-28 LAB — COMPREHENSIVE METABOLIC PANEL
ALT: 13 IU/L (ref 0–32)
Albumin/Globulin Ratio: 1.4 (ref 1.1–2.5)
Albumin: 4 g/dL (ref 3.5–5.5)
BUN: 11 mg/dL (ref 6–24)
Calcium: 9.3 mg/dL (ref 8.7–10.2)
Creatinine, Ser: 0.7 mg/dL (ref 0.57–1.00)
GFR calc Af Amer: 118 mL/min/{1.73_m2} (ref >59)
GFR calc non Af Amer: 103 mL/min/{1.73_m2} (ref >59)
Glucose: 86 mg/dL (ref 65–99)
Potassium: 3.8 mmol/L (ref 3.5–5.2)
Total Protein: 6.8 g/dL (ref 6.0–8.5)

## 2011-06-28 LAB — LIPID PANEL WITH LDL/HDL RATIO
HDL: 67 mg/dL (ref >39)
LDL Calculated: 84 mg/dL (ref 0–99)
VLDL Cholesterol Cal: 15 mg/dL (ref 5–40)

## 2011-06-28 LAB — TSH: TSH: 2.29 u[IU]/mL (ref 0.450–4.500)

## 2011-06-29 ENCOUNTER — Encounter: Payer: Self-pay | Admitting: Family Medicine

## 2011-07-08 ENCOUNTER — Encounter: Payer: Self-pay | Admitting: *Deleted

## 2011-07-12 ENCOUNTER — Telehealth: Payer: Self-pay | Admitting: Internal Medicine

## 2011-07-12 NOTE — Telephone Encounter (Signed)
Patient was put on Nexium 40mg  on 06/27/11 while she was in the hospital and you had informed her to stay on the Nexium.  She stated her stomach is burning all the time and  last week she started having diarrhea and stomach ache and burning.  She is wondering if the Nexium is to strong. Please advise.

## 2011-07-12 NOTE — Telephone Encounter (Signed)
Perhaps she is having side effect?  Please ask her to stop it 2-3 d and update me with how symptoms are -then will make plan

## 2011-07-13 ENCOUNTER — Telehealth: Payer: Self-pay | Admitting: Internal Medicine

## 2011-07-13 NOTE — Telephone Encounter (Signed)
Informed patient that she maybe having a side effect and  to stop her Nexium for 3 days and give [place] a call back on Friday and see if her symptoms go away. If so will make another plan.

## 2011-07-15 ENCOUNTER — Telehealth: Payer: Self-pay | Admitting: Internal Medicine

## 2011-07-15 NOTE — Telephone Encounter (Signed)
Patient notified as instructed by telephone. Pt said she stopped on Wed and is doing better but will update Dr Milinda Antis on Mon.

## 2011-07-15 NOTE — Telephone Encounter (Signed)
Patient states she is better but still a little burning in your stomach since stopping the nexium for 3 days.  Please advise.

## 2011-07-18 NOTE — Telephone Encounter (Signed)
Patient notified as instructed by telephone. 

## 2011-07-18 NOTE — Telephone Encounter (Signed)
Patient states since she stopped the Nexium last Tuesday it is a lot better but she is having indigestion and some belching and some burning in stomach but not like it was.  Please advise.

## 2011-07-18 NOTE — Telephone Encounter (Signed)
I would like her to try some zantac 150mg  otc bid for a week and then update me  If no further improvement at that time perhaps consider GI consult

## 2011-08-17 ENCOUNTER — Telehealth: Payer: Self-pay | Admitting: Internal Medicine

## 2011-08-17 NOTE — Telephone Encounter (Signed)
I recommend nasal saline spray or netti pot/ warm compresses on sinuses Plain mucinex (not D)  Afrin ns is ok for 1-2 days only for congestion if needed otc nsaid is ok if tolerated F/u if not imp after a week or so

## 2011-08-17 NOTE — Telephone Encounter (Signed)
Patient called and said she has a sinus infection every year.  She has a sore throat, headache, drainage and teeth hurt and head congestion.  She would like to know what she could get OTC to help with this.

## 2011-08-17 NOTE — Telephone Encounter (Signed)
Left v/m for pt to call back on work and cell #. Pt was not at home.

## 2011-08-18 NOTE — Telephone Encounter (Signed)
Patient notified as instructed by telephone. 

## 2011-08-29 ENCOUNTER — Ambulatory Visit: Payer: BC Managed Care – PPO | Admitting: Family Medicine

## 2011-09-20 ENCOUNTER — Telehealth: Payer: Self-pay | Admitting: Family Medicine

## 2011-09-20 NOTE — Telephone Encounter (Signed)
Spoke with pt and she is having hot flashes, night sweats are terrible waking up 5-6 times a night wringing wet. Vaginal dryness, last time after sexual intercourse pt had bleeding and pt feels irritable. Pt wants to know if Dr Milinda Antis can take care of or should pt see her GYN. Pt said she would not need referral if Dr Milinda Antis thought GYN would be better. Pt can be reached at [phone].

## 2011-09-20 NOTE — Telephone Encounter (Signed)
Patient notified as instructed by telephone. Pt will call GYN for appt.

## 2011-09-20 NOTE — Telephone Encounter (Signed)
If she already has a gyn- make appt with them, if not can see me

## 2011-09-20 NOTE — Telephone Encounter (Signed)
To: [facility] (Daytime Triage) Fax: [phone] From: [facility] Date/ Time: 09/19/2011 12:20 PM Taken By: Annalee Genta, CSR Caller: Tyler Facility: not collected Patient: Robin Henson, Robin Henson DOB: 02-10-1961 Phone: [phone] Reason for Call: Please call pt to advise if she should see Dr Milinda Antis for menopausal symptoms, or call GYN to make appt. Regarding Appointment: Appt Date: Appt Time: Unknown Provider: Reason: Details: Outcome:

## 2011-11-21 ENCOUNTER — Telehealth: Payer: Self-pay | Admitting: Family Medicine

## 2011-11-21 NOTE — Telephone Encounter (Signed)
Patient advised as instructed via telephone, she stated that the pain and burning has eased up some.  She will keep her appt for tomorrow with Dr. Milinda Antis unless things start to improve.  She stated that she was not hurting bad enough to go to UC or ER.

## 2011-11-21 NOTE — Telephone Encounter (Signed)
If pain is truly unbearable - I think she needs to go to UC or ER to be seen now  Otherwise can try a little antacid or pepto bismol - but I am nervous about her waiting if pain is severe - may need imaging study like a CT scan (watch this closely)

## 2011-11-21 NOTE — Telephone Encounter (Signed)
aller: Tiani/Other; PCP: Tower, Marne A.; CB#: [phone]; ; ; Call regarding Stomach Pain Following Vomiting Virus;  Pt is calling to say their whole family had the stomach virus. Pt vomited on Thursday 11/17/11 - she vomited x 6 with diarrhea. She has not vomited since but she has had stomach pain since and no energy. No fever. Pt has continued with a sharp burning pain in the middle of her abdomen. Pt states the pain is unbearable at time. It hurts to eat and drink anything. Pt only wants to see Dr. Milinda Antis because she knows her and her hx of stomach trouble. No appts available today. RN offered another appt with a different Provider. Pt refused. Rn scheduled pt at 12:15 tomorrow and advised pt I would send note to MD to see if she would recommend anything to help with discomfort until pt is seen in the am. OFFICE PLEASE CALL PT BACK .

## 2011-11-22 ENCOUNTER — Encounter: Payer: Self-pay | Admitting: Family Medicine

## 2011-11-22 ENCOUNTER — Ambulatory Visit (INDEPENDENT_AMBULATORY_CARE_PROVIDER_SITE_OTHER): Payer: BC Managed Care – PPO | Admitting: Family Medicine

## 2011-11-22 VITALS — BP 120/70 | HR 69 | Temp 97.6°F | Ht 63.0 in | Wt 137.2 lb

## 2011-11-22 DIAGNOSIS — Z8719 Personal history of other diseases of the digestive system: Secondary | ICD-10-CM | POA: Insufficient documentation

## 2011-11-22 DIAGNOSIS — R1013 Epigastric pain: Secondary | ICD-10-CM | POA: Insufficient documentation

## 2011-11-22 DIAGNOSIS — K2970 Gastritis, unspecified, without bleeding: Secondary | ICD-10-CM

## 2011-11-22 LAB — CBC WITH DIFFERENTIAL/PLATELET
Basophils Relative: 0.5 % (ref 0.0–3.0)
Eosinophils Absolute: 0 10*3/uL (ref 0.0–0.7)
Eosinophils Relative: 0.3 % (ref 0.0–5.0)
HCT: 39.6 % (ref 36.0–46.0)
Lymphs Abs: 2 10*3/uL (ref 0.7–4.0)
MCHC: 33.7 g/dL (ref 30.0–36.0)
MCV: 94.2 fl (ref 78.0–100.0)
Monocytes Absolute: 0.4 10*3/uL (ref 0.1–1.0)
Neutro Abs: 2.9 10*3/uL (ref 1.4–7.7)
RBC: 4.2 Mil/uL (ref 3.87–5.11)
WBC: 5.3 10*3/uL (ref 4.5–10.5)

## 2011-11-22 MED ORDER — SUCRALFATE 1 GM/10ML PO SUSP: 1.0000 g | Freq: Four times a day (QID) | ORAL | Status: DC

## 2011-11-22 NOTE — Assessment & Plan Note (Signed)
After viral gastroenteritis  Suspect gastritis- hx of sensitive stomach with med intol Will tx with carafate and update  Also cbc and amylase/lipase today Update if not starting to improve in a week or if worsening

## 2011-11-22 NOTE — Assessment & Plan Note (Signed)
Epigastric pain s/p viral gastroenteritis - suspect inflammation  Pt does not tol ppi and no help with H2 blocker Will try carafate up to four times daily Bland diet - adv as tol and update
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
## 2011-11-22 NOTE — Patient Instructions (Signed)
Try the carafate as directed up to 4 times daily  Stick with bland diet - BRAT - bananas/ rice/ apple sauce/ toast- also baked potato Make sure to catch up on fluids Labs today  Will update you  Update if not starting to improve in a week or if worsening

## 2011-11-23 ENCOUNTER — Encounter: Payer: Self-pay | Admitting: *Deleted

## 2011-11-23 ENCOUNTER — Telehealth: Payer: Self-pay | Admitting: Family Medicine

## 2011-11-23 NOTE — Telephone Encounter (Signed)
Caller: Sharie/Patient; PCP: Tower, Marne A.; CB#: [phone]; ; ; Call regarding Was Given Carafate for Stomach Burning and Now She Is Waking Up in the Night With It Burning.; states is not sure if she is taking it right.  States she takes it at 1830, then she eats, but by 2300 she takes it again, but by 0200 it awakens her from sleep.  States it seems to make her worse; states she vomited x 1 on arising AM 11/23/11.  Onset of vomiting/GI bug 11/17/11.  States she wonders if the medication makes her stomach worse?  Advised to take it QID as directed, on an empty stomach at least an hour before a meal.  Per protocol, emergent symptoms denied; advised appt if not improved within 72 hours.  INFO TO OFFICE FOR PROVIDER REVIEW/RECOMMENDATIONS/CALLBACK.   MAY REACH PATIENT AT [phone].

## 2011-11-23 NOTE — Telephone Encounter (Signed)
Patient advised as instructed via telephone. 

## 2011-11-23 NOTE — Telephone Encounter (Signed)
Let her know -- I have never heard of worsening symptoms with it , go ahead and take as directed 1 hour before meals and if symptoms persist I will refer to GI

## 2012-01-09 ENCOUNTER — Ambulatory Visit: Payer: Self-pay | Admitting: General Surgery

## 2012-03-05 ENCOUNTER — Ambulatory Visit (INDEPENDENT_AMBULATORY_CARE_PROVIDER_SITE_OTHER): Payer: BC Managed Care – PPO | Admitting: Family Medicine

## 2012-03-05 ENCOUNTER — Encounter: Payer: Self-pay | Admitting: Family Medicine

## 2012-03-05 VITALS — BP 104/68 | HR 60 | Temp 97.5°F | Wt 137.0 lb

## 2012-03-05 DIAGNOSIS — R300 Dysuria: Secondary | ICD-10-CM

## 2012-03-05 LAB — POCT URINALYSIS DIPSTICK
Bilirubin, UA: NEGATIVE
Blood, UA: NEGATIVE
Glucose, UA: NEGATIVE
Ketones, UA: NEGATIVE
Nitrite, UA: NEGATIVE
Spec Grav, UA: 1.015
pH, UA: 6

## 2012-03-05 MED ORDER — NITROFURANTOIN MONOHYD MACRO 100 MG PO CAPS: 100.0000 mg | ORAL_CAPSULE | Freq: Two times a day (BID) | ORAL | Status: AC

## 2012-03-05 NOTE — Patient Instructions (Addendum)
Unfortunately, your urine was clear. Please call Dr. Belva Crome office today.  I have sent in macrobid to take in case this is an early infection. Please call us with an update after you see your urologist.
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
## 2012-07-09 ENCOUNTER — Ambulatory Visit: Payer: Self-pay | Admitting: Sports Medicine

## 2012-07-16 ENCOUNTER — Telehealth: Payer: Self-pay | Admitting: Family Medicine

## 2012-07-16 DIAGNOSIS — Z0000 Encounter for general adult medical examination without abnormal findings: Secondary | ICD-10-CM

## 2012-07-16 NOTE — Telephone Encounter (Signed)
Message copied by Judy Pimple on Mon Jul 16, 2012  9:24 PM ------      Message from: Baldomero Lamy      Created: Wed Jul 11, 2012  2:07 PM      Regarding: Cpx labs Tues 07/17/12       Please order  future cpx labs for pt's upcomming lab appt.      Thanks      Rodney Booze

## 2012-07-17 ENCOUNTER — Other Ambulatory Visit (INDEPENDENT_AMBULATORY_CARE_PROVIDER_SITE_OTHER): Payer: BC Managed Care – PPO

## 2012-07-17 DIAGNOSIS — Z0000 Encounter for general adult medical examination without abnormal findings: Secondary | ICD-10-CM

## 2012-07-17 NOTE — Addendum Note (Signed)
Addended by: Alvina Chou on: 07/17/2012 09:04 AM   Modules accepted: Orders

## 2012-07-18 LAB — CBC WITH DIFFERENTIAL
Basophils Absolute: 0 10*3/uL (ref 0.0–0.2)
Immature Granulocytes: 0 % (ref 0–2)
Lymphocytes Absolute: 1.9 10*3/uL (ref 0.7–3.1)
MCV: 93 fL (ref 79–97)
Monocytes: 9 % (ref 4–12)
Platelets: 248 10*3/uL (ref 155–379)
RDW: 12.8 % (ref 12.3–15.4)
WBC: 4.9 10*3/uL (ref 3.4–10.8)

## 2012-07-18 LAB — COMPREHENSIVE METABOLIC PANEL
ALT: 12 IU/L (ref 0–32)
AST: 15 IU/L (ref 0–40)
Albumin: 4.5 g/dL (ref 3.5–5.5)
Alkaline Phosphatase: 42 IU/L (ref 39–117)
BUN/Creatinine Ratio: 22 (ref 9–23)
BUN: 15 mg/dL (ref 6–24)
CO2: 27 mmol/L (ref 19–28)
Chloride: 102 mmol/L (ref 97–108)
Sodium: 143 mmol/L (ref 134–144)

## 2012-07-18 LAB — LIPID PANEL
Cholesterol, Total: 197 mg/dL (ref 100–199)
Triglycerides: 68 mg/dL (ref 0–149)

## 2012-07-27 ENCOUNTER — Encounter: Payer: Self-pay | Admitting: Family Medicine

## 2012-07-27 ENCOUNTER — Ambulatory Visit (INDEPENDENT_AMBULATORY_CARE_PROVIDER_SITE_OTHER): Payer: BC Managed Care – PPO | Admitting: Family Medicine

## 2012-07-27 VITALS — BP 108/68 | HR 65 | Temp 97.9°F | Ht 63.0 in | Wt 134.8 lb

## 2012-07-27 DIAGNOSIS — Z0000 Encounter for general adult medical examination without abnormal findings: Secondary | ICD-10-CM

## 2012-07-27 NOTE — Patient Instructions (Addendum)
Keep working on [employer] and then exercise when you can Take care of yourself  Call your insurance about a bone density test - tell them your chiropractor thinks your bone mass is low - let me know if and when you need to schedule a bone density test
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
## 2012-07-27 NOTE — Assessment & Plan Note (Signed)
Reviewed health habits including diet and exercise and skin cancer prevention Also reviewed health mt list, fam hx and immunizations  Reviewed wellness labs  Pt will get back to exercise when she is able

## 2012-08-08 DIAGNOSIS — N951 Menopausal and female climacteric states: Secondary | ICD-10-CM | POA: Insufficient documentation

## 2012-10-10 IMAGING — CR DG CHEST 1V PORT
1 series · 1 of 1 positions shown · non-contrast
Comparison: none

REASON FOR EXAM: CP
COMMENTS:

PROCEDURE:     DXR - DXR PORTABLE CHEST SINGLE VIEW  - June 23, 2011 [DATE]
RESULT:     The lung fields are clear. No pneumonia, pneumothorax or pleural
effusion is seen. Heart size is normal. Monitoring electrodes are present.
No acute bony abnormalities are noted.

[view not recorded]
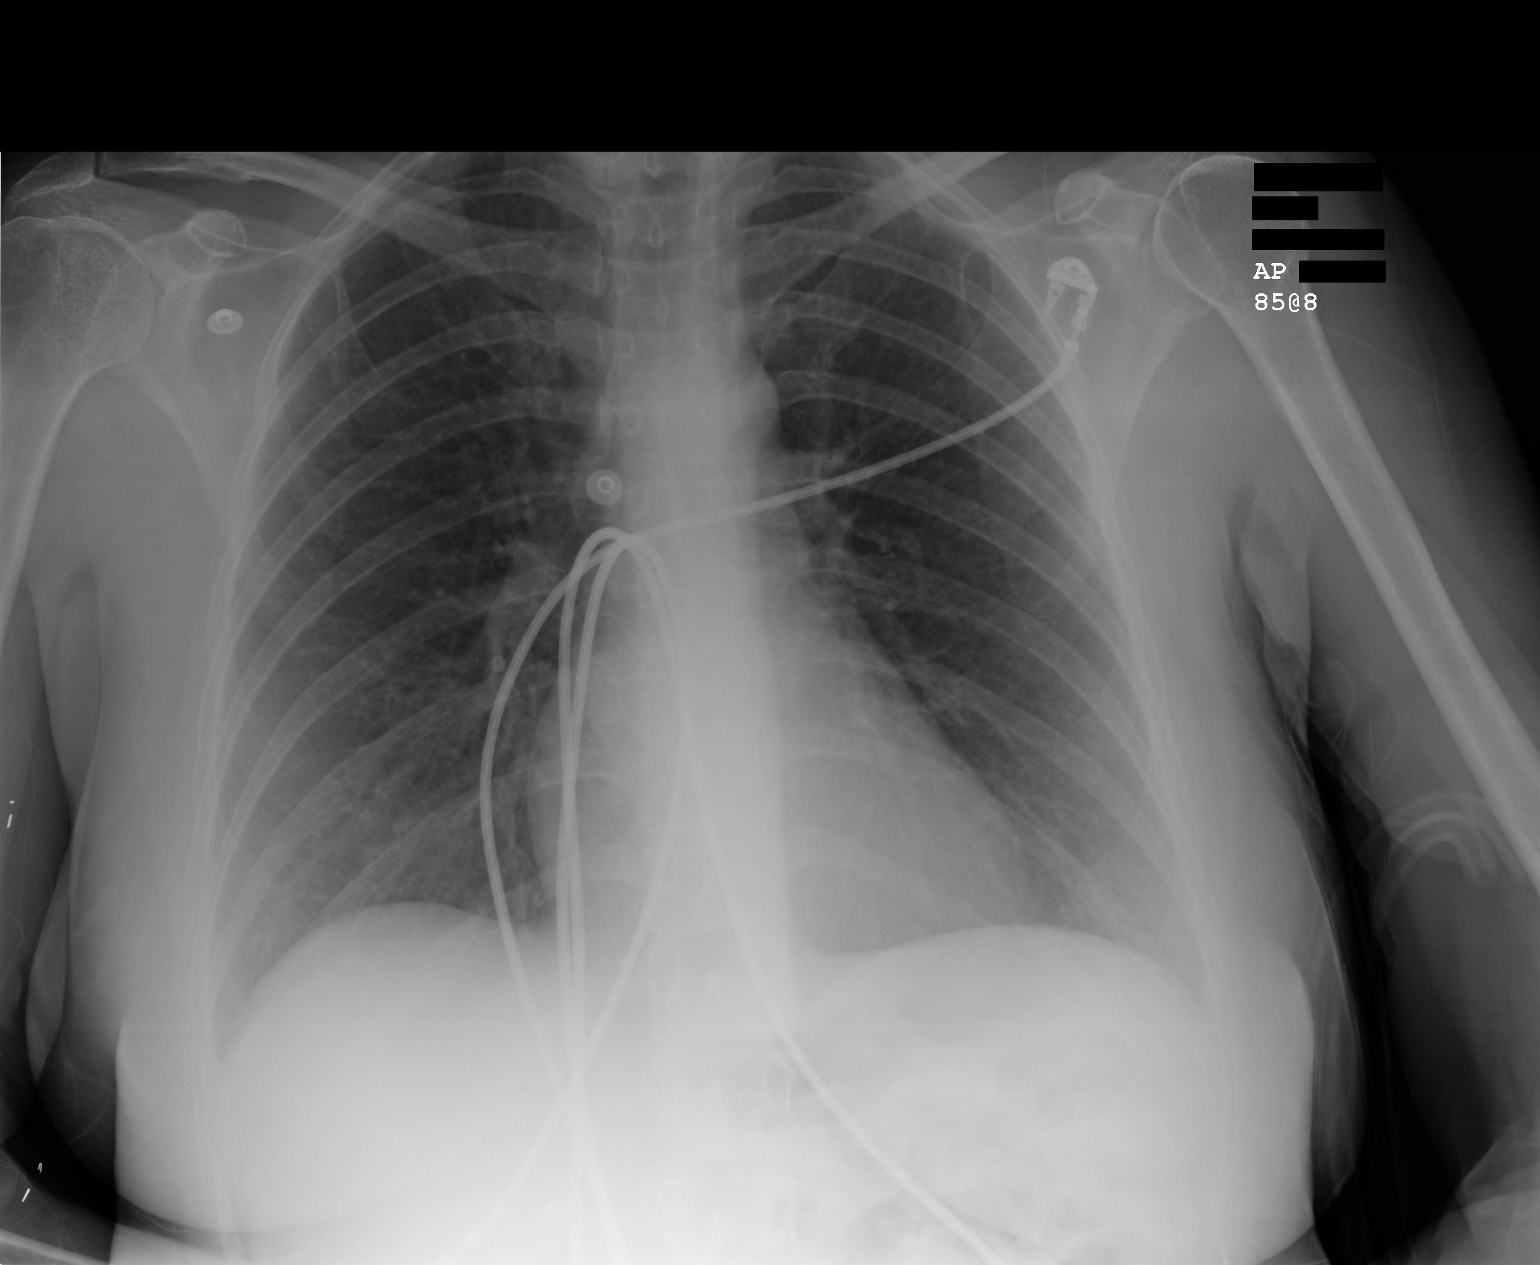

[1 of 1 positions shown; findings below may reference images not displayed]

IMPRESSION: 1.     No acute changes are identified.

## 2013-01-10 ENCOUNTER — Ambulatory Visit: Payer: Self-pay | Admitting: General Surgery

## 2013-01-15 ENCOUNTER — Encounter: Payer: Self-pay | Admitting: General Surgery

## 2013-01-16 ENCOUNTER — Ambulatory Visit (INDEPENDENT_AMBULATORY_CARE_PROVIDER_SITE_OTHER): Payer: BC Managed Care – PPO | Admitting: Family Medicine

## 2013-01-16 ENCOUNTER — Encounter: Payer: Self-pay | Admitting: Family Medicine

## 2013-01-16 VITALS — BP 116/76 | HR 74 | Temp 98.4°F | Ht 63.0 in | Wt 139.2 lb

## 2013-01-16 DIAGNOSIS — H53133 Sudden visual loss, bilateral: Secondary | ICD-10-CM

## 2013-01-16 DIAGNOSIS — R51 Headache: Secondary | ICD-10-CM

## 2013-01-16 DIAGNOSIS — G44009 Cluster headache syndrome, unspecified, not intractable: Secondary | ICD-10-CM

## 2013-01-16 DIAGNOSIS — H53139 Sudden visual loss, unspecified eye: Secondary | ICD-10-CM | POA: Insufficient documentation
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
## 2013-01-16 NOTE — Patient Instructions (Addendum)
Start 81 mg aspirin (coated) once daily with a meal  Checking for temporal arteritis (blood test called a sed rate) - will draw that today  Checking carotid ultrasound to look for plaque in the arteries (which can cause a loss of vision called amaurosis fugax) -- Shirlee Limerick will call you to schedule this  If you have worsened symptoms while out of town - call Dr Melinda Crutch as planned or go to there ER If you suddenly loose vision- go to the ER  Follow up with me about 1-2 weeks after you have the carotid test

## 2013-01-17 LAB — SEDIMENTATION RATE: Sed Rate: 10 mm/hr (ref 0–22)

## 2013-01-17 NOTE — Assessment & Plan Note (Signed)
Suspect this is due to her cluster migraine headaches- but check sed rate in light of tenderness in the area

## 2013-01-17 NOTE — Assessment & Plan Note (Signed)
Suspect this was migraine related but will check carotid dopplers to r/o amaurosis  Will f/u after the test  Needs better ha control  Will continue opth f/u for vitreous detachment and watch for worsening of that

## 2013-01-17 NOTE — Assessment & Plan Note (Signed)
?   If resp for vision change last week? - disc aura/ disc ha worsening with hormone change  Lab today and carotid dopplers ordered and will f/u to disc in more detail  May need to be on prophylaxis

## 2013-01-18 ENCOUNTER — Encounter: Payer: Self-pay | Admitting: Family Medicine

## 2013-01-18 ENCOUNTER — Telehealth: Payer: Self-pay | Admitting: Family Medicine

## 2013-01-18 ENCOUNTER — Encounter: Payer: Self-pay | Admitting: *Deleted

## 2013-01-18 ENCOUNTER — Ambulatory Visit: Payer: BC Managed Care – PPO | Admitting: Family Medicine

## 2013-01-18 NOTE — Telephone Encounter (Signed)
Carotid set up on 01/28/13 at 3:30pm in [address] [facility], patient notified.

## 2013-01-18 NOTE — Telephone Encounter (Signed)
Shirlee Limerick- I referred this pt for carotid duplex- and she left after 5 pm- can you check on the referral and call her ? Thanks

## 2013-01-30 ENCOUNTER — Encounter (INDEPENDENT_AMBULATORY_CARE_PROVIDER_SITE_OTHER): Payer: BC Managed Care – PPO

## 2013-01-30 DIAGNOSIS — H53129 Transient visual loss, unspecified eye: Secondary | ICD-10-CM

## 2013-01-30 DIAGNOSIS — H547 Unspecified visual loss: Secondary | ICD-10-CM

## 2013-01-31 ENCOUNTER — Ambulatory Visit: Payer: Self-pay | Admitting: General Surgery

## 2013-02-05 ENCOUNTER — Encounter: Payer: Self-pay | Admitting: Family Medicine

## 2013-02-06 ENCOUNTER — Encounter: Payer: Self-pay | Admitting: General Surgery

## 2013-02-06 ENCOUNTER — Telehealth: Payer: Self-pay | Admitting: *Deleted

## 2013-02-06 ENCOUNTER — Ambulatory Visit (INDEPENDENT_AMBULATORY_CARE_PROVIDER_SITE_OTHER): Payer: BC Managed Care – PPO | Admitting: General Surgery

## 2013-02-06 VITALS — BP 98/62 | HR 74 | Resp 12 | Ht 63.0 in | Wt 140.0 lb

## 2013-02-06 DIAGNOSIS — N951 Menopausal and female climacteric states: Secondary | ICD-10-CM

## 2013-02-06 DIAGNOSIS — N6009 Solitary cyst of unspecified breast: Secondary | ICD-10-CM

## 2013-02-06 MED ORDER — TRAMADOL HCL 50 MG PO TABS: 50.0000 mg | ORAL_TABLET | Freq: Three times a day (TID) | ORAL | Status: DC | PRN

## 2013-02-06 NOTE — Telephone Encounter (Signed)
Please alert me if other symptoms - like vision change  I want to try tramadol (watch out for sedation)- and if severe headache- go to ER Otherwise I will see her Monday  Please call in tramadol

## 2013-02-06 NOTE — Patient Instructions (Addendum)
Patient to return in 1 year with bilateral screening mammogram. 

## 2013-02-06 NOTE — Telephone Encounter (Signed)
Pt left me a voicemail saying that you saw her a few weeks ago about a few issues but the main issue was HA. Pt said she has had a really bad HA for a few days now and nothing is helping, pt can't take ibuprofen but she has been taking tylenol with no relief. Pt has an appt with you on Monday 02/13/13 but wants to know if you could prescribe something to help with her HA until her appt on Monday, if not is there anything strong you can recommend OTC that doesn't have ibuprofen in it
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
## 2013-02-06 NOTE — Telephone Encounter (Signed)
Pt notified of Dr. Royden Purl recommendations, pt already had tramadol at home so she will try that, I didn't call in this Rx because pt said she didn't need it

## 2013-02-11 ENCOUNTER — Ambulatory Visit (INDEPENDENT_AMBULATORY_CARE_PROVIDER_SITE_OTHER): Payer: BC Managed Care – PPO | Admitting: Family Medicine

## 2013-02-11 ENCOUNTER — Encounter: Payer: Self-pay | Admitting: Family Medicine

## 2013-02-11 VITALS — BP 118/70 | HR 73 | Temp 98.5°F | Ht 63.0 in | Wt 139.0 lb

## 2013-02-11 DIAGNOSIS — H53139 Sudden visual loss, unspecified eye: Secondary | ICD-10-CM

## 2013-02-11 DIAGNOSIS — N951 Menopausal and female climacteric states: Secondary | ICD-10-CM

## 2013-02-11 DIAGNOSIS — H53133 Sudden visual loss, bilateral: Secondary | ICD-10-CM

## 2013-02-11 DIAGNOSIS — G44009 Cluster headache syndrome, unspecified, not intractable: Secondary | ICD-10-CM

## 2013-02-11 MED ORDER — VENLAFAXINE HCL ER 37.5 MG PO CP24: 37.5000 mg | ORAL_CAPSULE | Freq: Every day | ORAL | Status: DC

## 2013-02-11 NOTE — Assessment & Plan Note (Signed)
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

## 2013-02-11 NOTE — Assessment & Plan Note (Addendum)
Suspect her hormone changes are exac this migraine syndrome Disc imp of inc water and dec caff intake- overall lifestyle changes  >25 min spent with face to face with patient, >50% counseling and/or coordinating care Trial of effexor xr low dose  F/u 4-5 weeks Gabapentin or other anti seizure agents are also options ,and f/u with Dr Sharene Skeans if not imp
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
## 2013-02-11 NOTE — Patient Instructions (Addendum)
For hot flashes and pain issues and headache prevention- start effexor xr 37.5 once daily  If any side effects or if this causes mood changes -please hold it and let me know  Your carotid ultrasound is reassuring  Continue follow up with your eye doctor  Follow up with me in 4-5 weeks

## 2013-02-11 NOTE — Assessment & Plan Note (Signed)
Disc trial of effexor xr low dose- as this may help hot flashes (not a good candidate for HRT)  This may also help with her headaches and chronic pain issues  Disc poss side eff F/u 4-5 weeks

## 2013-02-12 ENCOUNTER — Encounter: Payer: Self-pay | Admitting: Family Medicine

## 2013-02-25 ENCOUNTER — Encounter: Payer: Self-pay | Admitting: Family Medicine

## 2013-02-27 ENCOUNTER — Telehealth: Payer: Self-pay | Admitting: *Deleted

## 2013-02-27 ENCOUNTER — Telehealth: Payer: Self-pay | Admitting: Gastroenterology

## 2013-02-27 NOTE — Telephone Encounter (Signed)
appt scheduled for 03/01/13

## 2013-02-27 NOTE — Telephone Encounter (Signed)
Pt was offered 03/04/13 appt with Dr Christella Hartigan, she declined next available is 03/27/13.  Pt was scheduled and put on a wait list.

## 2013-02-27 NOTE — Telephone Encounter (Signed)
Get some zantac otc 150 mg and take it twice daily and schedule appt please

## 2013-02-27 NOTE — Telephone Encounter (Signed)
Pt called and wanted to let you know that she is having bad GERD, you advise her through mychart to try nexium, pt tried it today and that made it even worse. Every time pt eats or drinks (even water) she is having really bad chest pain that radiates to her back and left arm. Pt said she was previously on zantac and it gave her a little relief but not much pt wants to know what she should do please advise

## 2013-03-01 ENCOUNTER — Encounter: Payer: Self-pay | Admitting: Family Medicine

## 2013-03-01 ENCOUNTER — Ambulatory Visit (INDEPENDENT_AMBULATORY_CARE_PROVIDER_SITE_OTHER)
Admission: RE | Admit: 2013-03-01 | Discharge: 2013-03-01 | Disposition: A | Discharge status: Home / Self Care | Payer: BC Managed Care – PPO | Source: Ambulatory Visit | Attending: Family Medicine | Admitting: Family Medicine

## 2013-03-01 ENCOUNTER — Ambulatory Visit (INDEPENDENT_AMBULATORY_CARE_PROVIDER_SITE_OTHER): Payer: BC Managed Care – PPO | Admitting: Family Medicine

## 2013-03-01 VITALS — BP 104/62 | HR 68 | Temp 98.1°F | Ht 63.0 in | Wt 135.5 lb

## 2013-03-01 DIAGNOSIS — R079 Chest pain, unspecified: Secondary | ICD-10-CM | POA: Insufficient documentation

## 2013-03-01 DIAGNOSIS — K219 Gastro-esophageal reflux disease without esophagitis: Secondary | ICD-10-CM

## 2013-03-01 DIAGNOSIS — K3189 Other diseases of stomach and duodenum: Secondary | ICD-10-CM

## 2013-03-01 MED ORDER — METOCLOPRAMIDE HCL 5 MG PO TABS: 5.0000 mg | ORAL_TABLET | Freq: Three times a day (TID) | ORAL | Status: DC

## 2013-03-01 MED ORDER — DEXLANSOPRAZOLE 60 MG PO CPDR: 60.0000 mg | DELAYED_RELEASE_CAPSULE | Freq: Every day | ORAL | Status: DC

## 2013-03-01 NOTE — Patient Instructions (Addendum)
Avoid spicy and acidic foods  Try dexilant instead of nexium or prilosec  Go on line to get a coupon for it  Also you can try reglan with meals as needed - for the short term EKG and chest xray today - will update if any problems  Follow up with GI as planned
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
## 2013-03-03 NOTE — Assessment & Plan Note (Signed)
No acute change on EKG today- suspect due to gerd

## 2013-03-03 NOTE — Assessment & Plan Note (Signed)
See assessment for GERD Trial of dexilant and reglan

## 2013-03-03 NOTE — Assessment & Plan Note (Signed)
Worse than usual with inability to get control of symptoms with current PPI/zantac and diet  Will change to dexilant  Ref to GI Rev last EGD/us  Disc diet Also given px for reglan to help gastric motility - to see if this offers relief while waiting for her GI visit  Will update if worse or no improvement

## 2013-03-04 ENCOUNTER — Ambulatory Visit: Payer: BC Managed Care – PPO | Admitting: Gastroenterology

## 2013-03-14 ENCOUNTER — Encounter: Payer: Self-pay | Admitting: Family Medicine

## 2013-03-27 ENCOUNTER — Ambulatory Visit: Payer: BC Managed Care – PPO | Admitting: Gastroenterology

## 2013-06-13 ENCOUNTER — Other Ambulatory Visit: Payer: Self-pay

## 2013-08-08 HISTORY — PX: OTHER SURGICAL HISTORY: SHX169

## 2013-11-06 ENCOUNTER — Other Ambulatory Visit: Payer: BC Managed Care – PPO

## 2013-11-13 ENCOUNTER — Encounter: Payer: BC Managed Care – PPO | Admitting: Family Medicine

## 2013-11-20 ENCOUNTER — Telehealth: Payer: Self-pay | Admitting: Family Medicine

## 2013-11-20 DIAGNOSIS — Z0000 Encounter for general adult medical examination without abnormal findings: Secondary | ICD-10-CM

## 2013-11-20 NOTE — Telephone Encounter (Signed)
Message copied by Abner Greenspan on Wed Nov 20, 2013  7:26 PM ------      Message from: Ellamae Sia      Created: Thu Nov 07, 2013 12:28 PM      Regarding: Lab orders for Thursday, 4.16.15       Patient is scheduled for CPX labs, please order future labs, Thanks , Terri       ------

## 2013-11-21 ENCOUNTER — Other Ambulatory Visit (INDEPENDENT_AMBULATORY_CARE_PROVIDER_SITE_OTHER): Payer: BC Managed Care – PPO

## 2013-11-21 ENCOUNTER — Other Ambulatory Visit: Payer: BC Managed Care – PPO

## 2013-11-21 DIAGNOSIS — Z0000 Encounter for general adult medical examination without abnormal findings: Secondary | ICD-10-CM

## 2013-11-22 LAB — COMPREHENSIVE METABOLIC PANEL
ALT: 12 IU/L (ref 0–32)
AST: 16 IU/L (ref 0–40)
Albumin/Globulin Ratio: 2.4 (ref 1.1–2.5)
Albumin: 4.8 g/dL (ref 3.5–5.5)
Alkaline Phosphatase: 55 IU/L (ref 39–117)
BUN/Creatinine Ratio: 20 (ref 9–23)
BUN: 13 mg/dL (ref 6–24)
CO2: 28 mmol/L (ref 18–29)
Calcium: 10 mg/dL (ref 8.7–10.2)
Chloride: 102 mmol/L (ref 97–108)
Creatinine, Ser: 0.65 mg/dL (ref 0.57–1.00)
GFR calc Af Amer: 120 mL/min/{1.73_m2} (ref >59)
GFR, EST NON AFRICAN AMERICAN: 104 mL/min/{1.73_m2} (ref >59)
GLUCOSE: 87 mg/dL (ref 65–99)
Globulin, Total: 2 g/dL (ref 1.5–4.5)
POTASSIUM: 4 mmol/L (ref 3.5–5.2)
Sodium: 144 mmol/L (ref 134–144)
TOTAL PROTEIN: 6.8 g/dL (ref 6.0–8.5)
Total Bilirubin: 0.4 mg/dL (ref 0.0–1.2)

## 2013-11-22 LAB — CBC WITH DIFFERENTIAL/PLATELET
BASOS: 1 %
Basophils Absolute: 0 10*3/uL (ref 0.0–0.2)
EOS ABS: 0.1 10*3/uL (ref 0.0–0.4)
Eos: 1 %
HCT: 38.9 % (ref 34.0–46.6)
HEMOGLOBIN: 13.4 g/dL (ref 11.1–15.9)
IMMATURE GRANS (ABS): 0 10*3/uL (ref 0.0–0.1)
Immature Granulocytes: 0 %
LYMPHS: 46 %
Lymphocytes Absolute: 2 10*3/uL (ref 0.7–3.1)
MCH: 31 pg (ref 26.6–33.0)
MCHC: 34.4 g/dL (ref 31.5–35.7)
MCV: 90 fL (ref 79–97)
MONOCYTES: 9 %
Monocytes Absolute: 0.4 10*3/uL (ref 0.1–0.9)
NEUTROS ABS: 1.8 10*3/uL (ref 1.4–7.0)
NEUTROS PCT: 43 %
RBC: 4.32 x10E6/uL (ref 3.77–5.28)
RDW: 13.9 % (ref 12.3–15.4)
WBC: 4.3 10*3/uL (ref 3.4–10.8)

## 2013-11-22 LAB — LIPID PANEL
Chol/HDL Ratio: 2.7 ratio units (ref 0.0–4.4)
Cholesterol, Total: 168 mg/dL (ref 100–199)
HDL: 63 mg/dL (ref >39)
LDL Calculated: 89 mg/dL (ref 0–99)
Triglycerides: 80 mg/dL (ref 0–149)
VLDL Cholesterol Cal: 16 mg/dL (ref 5–40)

## 2013-11-22 LAB — TSH: TSH: 2.49 u[IU]/mL (ref 0.450–4.500)

## 2013-11-27 ENCOUNTER — Encounter: Payer: Self-pay | Admitting: Family Medicine

## 2013-11-27 ENCOUNTER — Ambulatory Visit (INDEPENDENT_AMBULATORY_CARE_PROVIDER_SITE_OTHER): Payer: BC Managed Care – PPO | Admitting: Family Medicine

## 2013-11-27 VITALS — BP 102/70 | HR 69 | Temp 97.8°F | Ht 63.0 in | Wt 136.2 lb

## 2013-11-27 DIAGNOSIS — Z8669 Personal history of other diseases of the nervous system and sense organs: Secondary | ICD-10-CM

## 2013-11-27 DIAGNOSIS — Z1239 Encounter for other screening for malignant neoplasm of breast: Secondary | ICD-10-CM | POA: Insufficient documentation

## 2013-11-27 DIAGNOSIS — C439 Malignant melanoma of skin, unspecified: Secondary | ICD-10-CM

## 2013-11-27 DIAGNOSIS — Z8781 Personal history of (healed) traumatic fracture: Secondary | ICD-10-CM

## 2013-11-27 DIAGNOSIS — Z0000 Encounter for general adult medical examination without abnormal findings: Secondary | ICD-10-CM

## 2013-11-27 DIAGNOSIS — Z85820 Personal history of malignant melanoma of skin: Secondary | ICD-10-CM | POA: Insufficient documentation

## 2013-11-27 DIAGNOSIS — Z1211 Encounter for screening for malignant neoplasm of colon: Secondary | ICD-10-CM

## 2013-11-27 DIAGNOSIS — M810 Age-related osteoporosis without current pathological fracture: Secondary | ICD-10-CM
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
## 2013-11-27 NOTE — Progress Notes (Signed)
Pre visit review using our clinic review tool, if applicable. No additional management support is needed unless otherwise documented below in the visit note. 

## 2013-11-27 NOTE — Patient Instructions (Signed)
Stop up front for referral for bone density test Try to get 1200-1500 mg of calcium per day with at least 1000 iu of vitamin D - for bone health (if you cannot tolerate the calcium just get vitamin D) Take care of yourself

## 2013-11-28 NOTE — Assessment & Plan Note (Signed)
[facility]/ thin  bones noted on xrays several times and recent arm fx in menopausal female dexa ordered

## 2013-11-28 NOTE — Assessment & Plan Note (Signed)
D/w patient [name] for colon cancer screening, including IFOB vs. colonoscopy.  Risks and benefits of both were discussed and patient voiced understanding.  Pt elects for: IFOB for now

## 2013-11-28 NOTE — Assessment & Plan Note (Signed)
Reviewed health habits including diet and exercise and skin cancer prevention Reviewed appropriate screening tests for age  Also reviewed health mt list, fam hx and immunization status , as well as social and family history   Labs reviewed  

## 2013-11-28 NOTE — Assessment & Plan Note (Signed)
Pt has f/u with derm for re excision soon

## 2013-12-19 ENCOUNTER — Ambulatory Visit: Payer: Self-pay | Admitting: Family Medicine

## 2013-12-19 ENCOUNTER — Encounter: Payer: Self-pay | Admitting: Family Medicine

## 2013-12-19 LAB — HM DEXA SCAN

## 2013-12-20 ENCOUNTER — Encounter: Payer: Self-pay | Admitting: Family Medicine

## 2013-12-27 ENCOUNTER — Encounter: Payer: Self-pay | Admitting: Family Medicine

## 2013-12-27 ENCOUNTER — Ambulatory Visit (INDEPENDENT_AMBULATORY_CARE_PROVIDER_SITE_OTHER): Payer: BC Managed Care – PPO | Admitting: Family Medicine

## 2013-12-27 VITALS — BP 98/62 | HR 66 | Temp 97.7°F | Ht 63.0 in | Wt 136.2 lb

## 2013-12-27 DIAGNOSIS — M810 Age-related osteoporosis without current pathological fracture: Secondary | ICD-10-CM

## 2013-12-27 DIAGNOSIS — J019 Acute sinusitis, unspecified: Secondary | ICD-10-CM | POA: Insufficient documentation

## 2013-12-27 MED ORDER — AZITHROMYCIN 250 MG PO TABS: ORAL_TABLET | ORAL | Status: DC

## 2013-12-27 MED ORDER — RALOXIFENE HCL 60 MG PO TABS: 60.0000 mg | ORAL_TABLET | Freq: Every day | ORAL | Status: DC

## 2013-12-27 MED ORDER — FLUCONAZOLE 150 MG PO TABS: 150.0000 mg | ORAL_TABLET | Freq: Once | ORAL | Status: DC

## 2013-12-27 MED ORDER — FLUTICASONE PROPIONATE 50 MCG/ACT NA SUSP: 2.0000 | Freq: Every day | NASAL | Status: DC

## 2013-12-27 NOTE — Patient Instructions (Signed)
Try to get 1200-1500 mg of calcium per day with at least 1000 iu of vitamin D - for bone health  Try evista one pill daily - if you do ok with it we use a 5 year course  Take care of yourself and exercise    Osteoporosis Throughout your life, your body breaks down old bone and replaces it with new bone. As you get older, your body does not replace bone as quickly as it breaks it down. By the age of 84 years, most people begin to gradually lose bone because of the imbalance between bone loss and replacement. Some people lose more bone than others. Bone loss beyond a specified normal degree is considered osteoporosis.  Osteoporosis affects the strength and durability of your bones. The inside of the ends of your bones and your flat bones, like the bones of your pelvis, look like honeycomb, filled with tiny open spaces. As bone loss occurs, your bones become less dense. This means that the open spaces inside your bones become bigger and the walls between these spaces become thinner. This makes your bones weaker. Bones of a person with osteoporosis can become so weak that they can break (fracture) during minor accidents, such as a simple fall. CAUSES  The following factors have been associated with the development of osteoporosis:  Smoking.  Drinking more than 2 alcoholic drinks several days per week.  Long-term use of certain medicines:  Corticosteroids.  Chemotherapy medicines.  Thyroid medicines.  Antiepileptic medicines.  Gonadal hormone suppression medicine.  Immunosuppression medicine.  Being underweight.  Lack of physical activity.  Lack of exposure to the sun. This can lead to vitamin D deficiency.  Certain medical conditions:  Certain inflammatory bowel diseases, such as Crohn disease and ulcerative colitis.  Diabetes.  Hyperthyroidism.  Hyperparathyroidism. RISK FACTORS Anyone can develop osteoporosis. However, the following factors can increase your risk of  developing osteoporosis:  Gender Women are at higher risk than men.  Age Being older than 2 years increases your risk.  Ethnicity White and Asian people have an increased risk.  Weight Being extremely underweight can increase your risk of osteoporosis.  Family history of osteoporosis Having a family member who has developed osteoporosis can increase your risk. SYMPTOMS  Usually, people with osteoporosis have no symptoms.  DIAGNOSIS  Signs during a physical exam that may prompt your caregiver to suspect osteoporosis include:  Decreased height. This is usually caused by the compression of the bones that form your spine (vertebrae) because they have weakened and become fractured.  A curving or rounding of the upper back (kyphosis). To confirm signs of osteoporosis, your caregiver may request a procedure that uses 2 low-dose X-ray beams with different levels of energy to measure your bone mineral density (dual-energy X-ray absorptiometry [DXA]). Also, your caregiver may check your level of vitamin D. TREATMENT  The goal of osteoporosis treatment is to strengthen bones in order to decrease the risk of bone fractures. There are different types of medicines available to help achieve this goal. Some of these medicines work by slowing the processes of bone loss. Some medicines work by increasing bone density. Treatment also involves making sure that your levels of calcium and vitamin D are adequate. PREVENTION  There are things you can do to help prevent osteoporosis. Adequate intake of calcium and vitamin D can help you achieve optimal bone mineral density. Regular exercise can also help, especially resistance and weight-bearing activities. If you smoke, quitting smoking is an important part of  osteoporosis prevention. MAKE SURE YOU:  Understand these instructions.  Will watch your condition.  Will get help right away if you are not doing well or get worse. FOR MORE INFORMATION www.osteo.org  and [employer].ee Document Released: 05/04/2005 Document Revised: 11/19/2012 Document Reviewed: 07/09/2011 [facility] Patient Information 2014 [place], [place].
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
## 2013-12-27 NOTE — Progress Notes (Signed)
Pre visit review using our clinic review tool, if applicable. No additional management support is needed unless otherwise documented below in the visit note. 

## 2013-12-29 NOTE — Assessment & Plan Note (Signed)
S/p viral uri  Disc symptomatic care - see instructions on AVS  Cover with zpack Update if not starting to improve in a week or if worsening

## 2013-12-29 NOTE — Assessment & Plan Note (Signed)
Disc need for calcium/ vitamin D/ wt bearing exercise and bone density test every 2 y to monitor Disc safety/ fracture risk in detail  Trial of evista-disc poss side eff in detail  Pt wants to hold off on bisphosphenate due to gerd

## 2014-01-01 ENCOUNTER — Encounter: Payer: Self-pay | Admitting: Family Medicine

## 2014-02-20 ENCOUNTER — Encounter: Payer: Self-pay | Admitting: General Surgery

## 2014-02-20 ENCOUNTER — Telehealth: Payer: Self-pay | Admitting: *Deleted

## 2014-02-20 NOTE — Telephone Encounter (Signed)
Message copied by Dominga Ferry on Thu Feb 20, 2014  2:38 PM ------      Message from: Imbler, Clifton W      Created: Thu Feb 20, 2014  2:27 PM       Additional views requested. Patient should come for planned appt on July 21 and will arrange additional views if needed post visit. Thanks. ------

## 2014-02-20 NOTE — Telephone Encounter (Signed)
Patient notified as instructed and will come in for appointment next week as planned. She verbalizes understanding.

## 2014-02-25 ENCOUNTER — Ambulatory Visit (INDEPENDENT_AMBULATORY_CARE_PROVIDER_SITE_OTHER): Payer: BC Managed Care – PPO | Admitting: General Surgery

## 2014-02-25 ENCOUNTER — Encounter: Payer: Self-pay | Admitting: General Surgery

## 2014-02-25 VITALS — BP 108/62 | HR 84 | Resp 12 | Ht 63.0 in | Wt 141.0 lb

## 2014-02-25 DIAGNOSIS — N6009 Solitary cyst of unspecified breast: Secondary | ICD-10-CM

## 2014-02-25 DIAGNOSIS — R928 Other abnormal and inconclusive findings on diagnostic imaging of breast: Secondary | ICD-10-CM
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
## 2014-02-25 NOTE — Patient Instructions (Addendum)
Patient to be arranged for additional views at [facility]. Patient to return in 1 year. Continue self breast exams. Call office for any new breast issues or concerns.  Patient is scheduled for additional views at [facility] on 02/28/14 at 11:00 am.

## 2014-02-26 DIAGNOSIS — R928 Other abnormal and inconclusive findings on diagnostic imaging of breast: Secondary | ICD-10-CM | POA: Insufficient documentation

## 2014-03-10 ENCOUNTER — Encounter: Payer: Self-pay | Admitting: General Surgery

## 2014-03-10 ENCOUNTER — Telehealth: Payer: Self-pay

## 2014-03-10 NOTE — Telephone Encounter (Signed)
Notified patient as instructed, patient pleased. Discussed follow-up appointments, patient agrees  

## 2014-03-10 NOTE — Telephone Encounter (Signed)
Message copied by Lesly Rubenstein on Mon Mar 10, 2014  3:12 PM ------      Message from: [place],  W      Created: Mon Mar 10, 2014  2:10 PM       Please let the patient know I looked at her films.  Likely a cyst.  Arrange for a brief OV (No copay) at her convenience for an ultrasound.  Thanks.      ----- Message -----         From: Darrin Nipper, CMA         Sent: 03/10/2014  11:17 AM           To: Robert Bellow, MD                   ------

## 2014-03-18 ENCOUNTER — Encounter: Payer: Self-pay | Admitting: General Surgery

## 2014-03-19 ENCOUNTER — Other Ambulatory Visit (INDEPENDENT_AMBULATORY_CARE_PROVIDER_SITE_OTHER): Payer: BC Managed Care – PPO

## 2014-03-19 ENCOUNTER — Ambulatory Visit: Payer: BC Managed Care – PPO | Admitting: General Surgery

## 2014-03-19 ENCOUNTER — Encounter: Payer: Self-pay | Admitting: General Surgery

## 2014-03-19 VITALS — BP 102/62 | HR 78 | Resp 12 | Ht 63.0 in | Wt 137.0 lb

## 2014-03-19 DIAGNOSIS — N6009 Solitary cyst of unspecified breast: Secondary | ICD-10-CM

## 2014-03-19 DIAGNOSIS — N6001 Solitary cyst of right breast: Secondary | ICD-10-CM
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
## 2014-03-19 NOTE — Patient Instructions (Signed)
Continue self breast exams. Call office for any new breast issues or concerns. 

## 2014-03-20 ENCOUNTER — Encounter: Payer: Self-pay | Admitting: General Surgery

## 2014-05-28 ENCOUNTER — Encounter: Payer: Self-pay | Admitting: Family Medicine

## 2014-05-28 ENCOUNTER — Ambulatory Visit (INDEPENDENT_AMBULATORY_CARE_PROVIDER_SITE_OTHER): Payer: BC Managed Care – PPO | Admitting: Family Medicine

## 2014-05-28 VITALS — BP 90/66 | HR 74 | Temp 98.2°F | Ht 63.0 in | Wt 130.8 lb

## 2014-05-28 DIAGNOSIS — M7712 Lateral epicondylitis, left elbow: Secondary | ICD-10-CM

## 2014-05-28 DIAGNOSIS — Z8781 Personal history of (healed) traumatic fracture: Secondary | ICD-10-CM

## 2014-05-28 MED ORDER — METHYLPREDNISOLONE ACETATE 40 MG/ML IJ SUSP
20.0000 mg | Freq: Once | INTRAMUSCULAR | Status: AC
  Administered 2014-05-28: 20 mg via INTRA_ARTICULAR
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
## 2014-05-28 NOTE — Progress Notes (Signed)
Pre visit review using our clinic review tool, if applicable. No additional management support is needed unless otherwise documented below in the visit note. 

## 2014-06-09 ENCOUNTER — Encounter: Payer: Self-pay | Admitting: Family Medicine

## 2014-08-08 HISTORY — PX: COLONOSCOPY: SHX174

## 2014-09-03 ENCOUNTER — Encounter: Payer: Self-pay | Admitting: Family Medicine

## 2014-09-04 ENCOUNTER — Telehealth: Payer: Self-pay

## 2014-09-04 NOTE — Telephone Encounter (Signed)
PLEASE NOTE: All timestamps contained within this report are represented as [place] Standard Time. CONFIDENTIALTY NOTICE: This fax transmission is intended only for the addressee. It contains information that is legally privileged, confidential or otherwise protected from use or disclosure. If you are not the intended recipient, you are strictly prohibited from reviewing, disclosing, copying using or disseminating any of this information or taking any action in reliance on or regarding this information. If you have received this fax in error, please notify us immediately by telephone so that we can arrange for its return to [place]. Phone: [phone], Toll-Free: [phone], Fax: [phone] Page: 1 of 1 Call Id: [id number] [place] Patient Name: Robin Henson Gender: Female DOB: [birth year] Age: 54 Y 31 M 7 D Return Phone Number: [phone] (Primary), [phone] (Secondary) Address: City/State/Zip: [place] Client [place] Night - Client Client Site [facility], [place] Contact Type Call Call Type Triage / Clinical Relationship To Patient Self Return Phone Number [phone] (Primary) Chief Complaint Health information question (non symptomatic) Initial Comment Caller states hands are tingling, feet can't get warm, mouth dry, legs tingling Nurse Assessment Nurse: Denyse Amass, RN, Benjamine Mola Date/Time (Eastern Time): 09/03/2014 8:32:48 PM Confirm and document reason for call. If symptomatic, describe symptoms. ---Patient states she had tingling in hands and feet and they felt cold earlier. States she got warmed up in the bed now and the tingling in her hand and feet are gone. States she has no symptoms now and feels much better. Has the patient traveled out of the country within the last 30 days? ---Not Applicable Does the  patient require triage? ---Declined Triage Guidelines Guideline Title Affirmed Question Affirmed Notes Nurse Date/Time (Cool Time) Disp. Time Eilene Ghazi Time) Disposition Final User 09/03/2014 8:36:00 PM Clinical Call Yes [place], RN, Benjamine Mola After Care Instructions Given Call Event Type User Date / Time Description

## 2014-10-06 ENCOUNTER — Encounter: Payer: Self-pay | Admitting: Family Medicine

## 2014-10-06 ENCOUNTER — Ambulatory Visit (INDEPENDENT_AMBULATORY_CARE_PROVIDER_SITE_OTHER): Payer: BLUE CROSS/BLUE SHIELD | Admitting: Family Medicine

## 2014-10-06 VITALS — BP 96/62 | HR 72 | Temp 98.0°F | Ht 63.0 in | Wt 125.0 lb

## 2014-10-06 DIAGNOSIS — Z1211 Encounter for screening for malignant neoplasm of colon: Secondary | ICD-10-CM

## 2014-10-06 NOTE — Assessment & Plan Note (Signed)
Refer to GI for first screening colonoscopy No symptoms

## 2014-10-06 NOTE — Patient Instructions (Signed)
Take care of yourself  Stop at check out for referral for colonoscopy for colon cancer screening
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
## 2014-10-06 NOTE — Progress Notes (Signed)
Pre visit review using our clinic review tool, if applicable. No additional management support is needed unless otherwise documented below in the visit note. 

## 2014-10-07 ENCOUNTER — Encounter: Payer: Self-pay | Admitting: Gastroenterology

## 2014-12-15 ENCOUNTER — Ambulatory Visit (AMBULATORY_SURGERY_CENTER): Payer: Self-pay

## 2014-12-15 VITALS — Ht 63.0 in | Wt 123.2 lb

## 2014-12-15 DIAGNOSIS — Z1211 Encounter for screening for malignant neoplasm of colon: Secondary | ICD-10-CM

## 2014-12-15 MED ORDER — MOVIPREP 100 G PO SOLR: 1.0000 | Freq: Once | ORAL | Status: DC

## 2014-12-29 ENCOUNTER — Encounter: Payer: Self-pay | Admitting: Gastroenterology

## 2014-12-29 ENCOUNTER — Ambulatory Visit (AMBULATORY_SURGERY_CENTER): Payer: BLUE CROSS/BLUE SHIELD | Admitting: Gastroenterology

## 2014-12-29 VITALS — BP 102/71 | HR 63 | Temp 96.9°F | Resp 14 | Ht 63.0 in | Wt 123.0 lb

## 2014-12-29 DIAGNOSIS — Z1211 Encounter for screening for malignant neoplasm of colon: Secondary | ICD-10-CM | POA: Diagnosis present

## 2014-12-29 MED ORDER — SODIUM CHLORIDE 0.9 % IV SOLN: 500.0000 mL | INTRAVENOUS | Status: DC

## 2014-12-29 NOTE — Op Note (Signed)
[facility]  [address] [employer] [address]. [place], [address]   COLONOSCOPY PROCEDURE REPORT  PATIENT: Robin Henson, Robin Henson  MR#: [id number] BIRTHDATE: 1961-04-04 , 53  yrs. old GENDER: female ENDOSCOPIST: Milus Banister, MD REFERRED ER:DEYCX Vernell Morgans, M.D. PROCEDURE DATE:  12/29/2014 PROCEDURE:   Colonoscopy, screening First Screening Colonoscopy - Avg.  risk and is 50 yrs.  old or older Yes.  Prior Negative Screening - Now for repeat screening. N/A  History of Adenoma - Now for follow-up colonoscopy & has been > or = to 3 yrs.  N/A  Recommend repeat exam, <10 yrs? No ASA CLASS:   Class II INDICATIONS:Screening for colonic neoplasia and Colorectal Neoplasm Risk Assessment for this procedure is average risk. MEDICATIONS: Monitored anesthesia care and Propofol 200 mg IV  DESCRIPTION OF PROCEDURE:   After the risks benefits and alternatives of the procedure were thoroughly explained, informed consent was obtained.  The digital rectal exam revealed no abnormalities of the rectum.   The [id number]  endoscope was introduced through the anus and advanced to the cecum, which was identified by both the appendix and ileocecal valve. No adverse events experienced.   The quality of the prep was excellent.  The instrument was then slowly withdrawn as the colon was fully examined. Estimated blood loss is zero unless otherwise noted in this procedure report.   COLON FINDINGS: A normal appearing cecum, ileocecal valve, and appendiceal orifice were identified.  The ascending, transverse, descending, sigmoid colon, and rectum appeared unremarkable. Retroflexed views revealed no abnormalities. The time to cecum = 4.4 Withdrawal time = 9.4   The scope was withdrawn and the procedure completed. COMPLICATIONS: There were no immediate complications.  ENDOSCOPIC IMPRESSION: Normal colonoscopy No polyps or cancers  RECOMMENDATIONS: You should continue to follow colorectal cancer screening  guidelines for "routine risk" patients with a repeat colonoscopy in 10 years.   eSigned:  Milus Banister, MD 12/29/2014 9:33 AM

## 2014-12-29 NOTE — Progress Notes (Signed)
Report to PACU, RN, vss, BBS= Clear.  

## 2014-12-29 NOTE — Patient Instructions (Signed)
Discharge instructions given. Normal exam. Resume previous medications. YOU HAD AN ENDOSCOPIC PROCEDURE TODAY AT THE [facility]:   Refer to the procedure report that was given to you for any specific questions about what was found during the examination.  If the procedure report does not answer your questions, please call your gastroenterologist to clarify.  If you requested that your care partner not be given the details of your procedure findings, then the procedure report has been included in a sealed envelope for you to review at your convenience later.  YOU SHOULD EXPECT: Some feelings of bloating in the abdomen. Passage of more gas than usual.  Walking can help get rid of the air that was put into your GI tract during the procedure and reduce the bloating. If you had a lower endoscopy (such as a colonoscopy or flexible sigmoidoscopy) you may notice spotting of blood in your stool or on the toilet paper. If you underwent a bowel prep for your procedure, you may not have a normal bowel movement for a few days.  Please Note:  You might notice some irritation and congestion in your nose or some drainage.  This is from the oxygen used during your procedure.  There is no need for concern and it should clear up in a day or so.  SYMPTOMS TO REPORT IMMEDIATELY:   Following lower endoscopy (colonoscopy or flexible sigmoidoscopy):  Excessive amounts of blood in the stool  Significant tenderness or worsening of abdominal pains  Swelling of the abdomen that is new, acute  Fever of 100F or higher   For urgent or emergent issues, a gastroenterologist can be reached at any hour by calling [phone].   DIET: Your first meal following the procedure should be a small meal and then it is ok to progress to your normal diet. Heavy or fried foods are harder to digest and may make you feel nauseous or bloated.  Likewise, meals heavy in dairy and vegetables can increase bloating.  Drink plenty  of fluids but you should avoid alcoholic beverages for 24 hours.  ACTIVITY:  You should plan to take it easy for the rest of today and you should NOT DRIVE or use heavy machinery until tomorrow (because of the sedation medicines used during the test).    FOLLOW UP: Our staff will call the number listed on your records the next business day following your procedure to check on you and address any questions or concerns that you may have regarding the information given to you following your procedure. If we do not reach you, we will leave a message.  However, if you are feeling well and you are not experiencing any problems, there is no need to return our call.  We will assume that you have returned to your regular daily activities without incident.  If any biopsies were taken you will be contacted by phone or by letter within the next 1-3 weeks.  Please call us at [phone] if you have not heard about the biopsies in 3 weeks.    SIGNATURES/CONFIDENTIALITY: You and/or your care partner have signed paperwork which will be entered into your electronic medical record.  These signatures attest to the fact that that the information above on your After Visit Summary has been reviewed and is understood.  Full responsibility of the confidentiality of this discharge information lies with you and/or your care-partner. 

## 2014-12-30 ENCOUNTER — Telehealth: Payer: Self-pay | Admitting: *Deleted

## 2014-12-30 NOTE — Telephone Encounter (Signed)
  Follow up Call-  Call back number 12/29/2014  Post procedure Call Back phone  # [phone]  Permission to leave phone message Yes     Patient questions:  Do you have a fever, pain , or abdominal swelling? No. Pain Score  0 *  Have you tolerated food without any problems? Yes.    Have you been able to return to your normal activities? Yes.    Do you have any questions about your discharge instructions: Diet   No. Medications  No. Follow up visit  No.  Do you have questions or concerns about your Care? No.  Actions: * If pain score is 4 or above: No action needed, pain <4.

## 2015-01-07 ENCOUNTER — Other Ambulatory Visit: Payer: Self-pay | Admitting: Family Medicine

## 2015-03-09 ENCOUNTER — Ambulatory Visit: Payer: Self-pay | Admitting: General Surgery

## 2015-03-19 ENCOUNTER — Ambulatory Visit (INDEPENDENT_AMBULATORY_CARE_PROVIDER_SITE_OTHER): Payer: BLUE CROSS/BLUE SHIELD | Admitting: General Surgery

## 2015-03-19 ENCOUNTER — Other Ambulatory Visit: Payer: BLUE CROSS/BLUE SHIELD

## 2015-03-19 ENCOUNTER — Encounter: Payer: Self-pay | Admitting: General Surgery

## 2015-03-19 VITALS — BP 108/68 | HR 72 | Resp 14 | Ht 63.0 in | Wt 122.0 lb

## 2015-03-19 DIAGNOSIS — N6310 Unspecified lump in the right breast, unspecified quadrant: Secondary | ICD-10-CM

## 2015-03-19 DIAGNOSIS — N63 Unspecified lump in breast: Secondary | ICD-10-CM | POA: Diagnosis not present

## 2015-03-19 DIAGNOSIS — N6001 Solitary cyst of right breast: Secondary | ICD-10-CM

## 2015-03-19 NOTE — Patient Instructions (Signed)
The patient has been asked to return to the office in one year with a bilateral screening mammogram. 
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
## 2015-03-21 ENCOUNTER — Telehealth: Payer: Self-pay | Admitting: Family Medicine

## 2015-03-21 DIAGNOSIS — Z0000 Encounter for general adult medical examination without abnormal findings: Secondary | ICD-10-CM

## 2015-03-21 DIAGNOSIS — M810 Age-related osteoporosis without current pathological fracture: Secondary | ICD-10-CM

## 2015-03-21 NOTE — Telephone Encounter (Signed)
-----   Message from Ellamae Sia sent at 03/18/2015 11:40 AM EDT ----- Regarding: Lab orders for Monday, 8.15.16 Patient is scheduled for CPX labs, please order future labs, Thanks , Karna Christmas

## 2015-03-23 ENCOUNTER — Other Ambulatory Visit (INDEPENDENT_AMBULATORY_CARE_PROVIDER_SITE_OTHER): Payer: BLUE CROSS/BLUE SHIELD

## 2015-03-23 DIAGNOSIS — Z0000 Encounter for general adult medical examination without abnormal findings: Secondary | ICD-10-CM

## 2015-03-23 DIAGNOSIS — M810 Age-related osteoporosis without current pathological fracture: Secondary | ICD-10-CM

## 2015-03-24 LAB — CBC WITH DIFFERENTIAL/PLATELET
Basophils Absolute: 0 10*3/uL (ref 0.0–0.2)
Basos: 1 %
EOS (ABSOLUTE): 0 10*3/uL (ref 0.0–0.4)
EOS: 1 %
HEMATOCRIT: 38.3 % (ref 34.0–46.6)
Hemoglobin: 13 g/dL (ref 11.1–15.9)
IMMATURE GRANULOCYTES: 0 %
Immature Grans (Abs): 0 10*3/uL (ref 0.0–0.1)
Lymphocytes Absolute: 2 10*3/uL (ref 0.7–3.1)
Lymphs: 50 %
MCH: 31.3 pg (ref 26.6–33.0)
MCHC: 33.9 g/dL (ref 31.5–35.7)
MCV: 92 fL (ref 79–97)
MONOCYTES: 6 %
MONOS ABS: 0.3 10*3/uL (ref 0.1–0.9)
NEUTROS PCT: 42 %
Neutrophils Absolute: 1.7 10*3/uL (ref 1.4–7.0)
Platelets: 209 10*3/uL (ref 150–379)
RBC: 4.16 x10E6/uL (ref 3.77–5.28)
RDW: 13.2 % (ref 12.3–15.4)
WBC: 4 10*3/uL (ref 3.4–10.8)

## 2015-03-24 LAB — COMPREHENSIVE METABOLIC PANEL
ALT: 11 IU/L (ref 0–32)
AST: 14 IU/L (ref 0–40)
Albumin/Globulin Ratio: 2 (ref 1.1–2.5)
Albumin: 4.3 g/dL (ref 3.5–5.5)
Alkaline Phosphatase: 51 IU/L (ref 39–117)
BUN / CREAT RATIO: 17 (ref 9–23)
BUN: 11 mg/dL (ref 6–24)
Bilirubin Total: 0.5 mg/dL (ref 0.0–1.2)
CALCIUM: 9.7 mg/dL (ref 8.7–10.2)
CO2: 26 mmol/L (ref 18–29)
Chloride: 102 mmol/L (ref 97–108)
Creatinine, Ser: 0.65 mg/dL (ref 0.57–1.00)
GFR calc Af Amer: 119 mL/min/{1.73_m2} (ref >59)
GFR, EST NON AFRICAN AMERICAN: 103 mL/min/{1.73_m2} (ref >59)
GLOBULIN, TOTAL: 2.1 g/dL (ref 1.5–4.5)
Glucose: 84 mg/dL (ref 65–99)
Potassium: 4.1 mmol/L (ref 3.5–5.2)
SODIUM: 144 mmol/L (ref 134–144)
Total Protein: 6.4 g/dL (ref 6.0–8.5)

## 2015-03-24 LAB — TSH: TSH: 2.83 u[IU]/mL (ref 0.450–4.500)

## 2015-03-24 LAB — LIPID PANEL
CHOLESTEROL TOTAL: 157 mg/dL (ref 100–199)
Chol/HDL Ratio: 2.3 ratio units (ref 0.0–4.4)
HDL: 67 mg/dL (ref >39)
LDL Calculated: 78 mg/dL (ref 0–99)
Triglycerides: 60 mg/dL (ref 0–149)
VLDL Cholesterol Cal: 12 mg/dL (ref 5–40)

## 2015-03-24 LAB — VITAMIN D 25 HYDROXY (VIT D DEFICIENCY, FRACTURES): Vit D, 25-Hydroxy: 27.7 ng/mL — ABNORMAL LOW (ref 30.0–100.0)

## 2015-03-30 ENCOUNTER — Ambulatory Visit (INDEPENDENT_AMBULATORY_CARE_PROVIDER_SITE_OTHER): Payer: BLUE CROSS/BLUE SHIELD | Admitting: Family Medicine

## 2015-03-30 ENCOUNTER — Encounter: Payer: Self-pay | Admitting: Family Medicine

## 2015-03-30 VITALS — BP 102/66 | HR 61 | Temp 98.2°F | Ht 63.0 in | Wt 122.8 lb

## 2015-03-30 DIAGNOSIS — Z0000 Encounter for general adult medical examination without abnormal findings: Secondary | ICD-10-CM

## 2015-03-30 DIAGNOSIS — M810 Age-related osteoporosis without current pathological fracture: Secondary | ICD-10-CM

## 2015-03-30 MED ORDER — FLUTICASONE PROPIONATE 50 MCG/ACT NA SUSP: 2.0000 | Freq: Every day | NASAL | Status: DC

## 2015-03-30 NOTE — Assessment & Plan Note (Signed)
Reviewed health habits including diet and exercise and skin cancer prevention Reviewed appropriate screening tests for age  Also reviewed health mt list, fam hx and immunization status , as well as social and family history   See HPI Labs reviewed  Will work on vit D repl Good cholesterol  She will get a flu shot in the fall  Declines HIV/ hep C screening -thinks she has been screened before

## 2015-03-30 NOTE — Assessment & Plan Note (Signed)
Pt declines treatment- decided not to take evista  Aware of fx risk  Also needs to start back on vit D (4000 iu daily) now - for low level Good exercise  Can get dexa after May 2017  Disc need for calcium/ vitamin D/ wt bearing exercise and bone density test every 2 y to monitor Disc safety/ fracture risk in detail

## 2015-03-30 NOTE — Patient Instructions (Signed)
Please start taking 4000 iu of vitamin D daily  If you change your mind about medication for osteoporosis please let me know  You are due for next bone density test after May 2017  Don't forget to get a flu shot   When you have your next blood draw - if you want screening for HIV/ Hepatitis C or a B12 level tell us to add that on
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
## 2015-03-30 NOTE — Progress Notes (Signed)
Pre visit review using our clinic review tool, if applicable. No additional management support is needed unless otherwise documented below in the visit note. 

## 2015-04-20 ENCOUNTER — Encounter: Payer: Self-pay | Admitting: General Surgery

## 2015-05-11 ENCOUNTER — Other Ambulatory Visit: Payer: Self-pay | Admitting: Family Medicine

## 2015-06-16 ENCOUNTER — Telehealth: Payer: Self-pay

## 2015-06-16 NOTE — Telephone Encounter (Signed)
Pt wants to know if had tdap; advised per immunization list historically listed 06/09/2013. Pt will ck with hospital that she did get tdap and call back if needed.

## 2015-08-19 ENCOUNTER — Telehealth: Payer: Self-pay

## 2015-08-19 MED ORDER — CYCLOBENZAPRINE HCL 10 MG PO TABS: 5.0000 mg | ORAL_TABLET | Freq: Three times a day (TID) | ORAL | Status: DC | PRN

## 2015-08-19 NOTE — Telephone Encounter (Signed)
Pt notified Rx sent to pharmacy and pt advise of Dr. Marliss Coots comments/ instructions and verbalized understanding

## 2015-08-19 NOTE — Telephone Encounter (Signed)
Pt left v/m; pt has hx of back issues; pts back went out this morning and pt saw chiropractor this morning and was advised back is locked up, muscles are tight and back is swollen. Pt wants muscle relaxers sent to [facility].Please advise. Last seen 03/30/15.

## 2015-08-19 NOTE — Telephone Encounter (Signed)
I will send flexeril Caution of sedation F/u if no improvement or if any neurologic symptoms (numbness or weakness)

## 2015-11-10 ENCOUNTER — Encounter: Payer: Self-pay | Admitting: Internal Medicine

## 2015-11-10 ENCOUNTER — Ambulatory Visit (INDEPENDENT_AMBULATORY_CARE_PROVIDER_SITE_OTHER): Payer: BLUE CROSS/BLUE SHIELD | Admitting: Internal Medicine

## 2015-11-10 VITALS — BP 98/62 | HR 60 | Temp 97.8°F | Wt 120.1 lb

## 2015-11-10 DIAGNOSIS — T3695XA Adverse effect of unspecified systemic antibiotic, initial encounter: Secondary | ICD-10-CM

## 2015-11-10 DIAGNOSIS — J0100 Acute maxillary sinusitis, unspecified: Secondary | ICD-10-CM | POA: Diagnosis not present

## 2015-11-10 DIAGNOSIS — B379 Candidiasis, unspecified: Secondary | ICD-10-CM

## 2015-11-10 MED ORDER — FLUCONAZOLE 150 MG PO TABS: 150.0000 mg | ORAL_TABLET | Freq: Once | ORAL | Status: DC

## 2015-11-10 MED ORDER — CEFUROXIME AXETIL 500 MG PO TABS: 500.0000 mg | ORAL_TABLET | Freq: Two times a day (BID) | ORAL | Status: DC

## 2015-11-10 NOTE — Progress Notes (Signed)
Pre visit review using our clinic review tool, if applicable. No additional management support is needed unless otherwise documented below in the visit note. 

## 2015-11-10 NOTE — Patient Instructions (Signed)

## 2015-11-10 NOTE — Progress Notes (Signed)
HPI  Pt presents to the clinic today with a 5 day h/o headache, facial pain and pressure, nasal congestion and cough.. She is blowing brown mucous out of her nose.The cough as productive with yellow sputum. She denies fever, chills or body aches. She has tried Dayquil, Sudafed, saline nasal spray and Flonase without relief. She has a h/o seasonal allergies. She has had sick contacts.   Review of Systems    Past Medical History  Diagnosis Date  . OA (osteoarthritis)   . Seasonal allergies   . Urinary incontinence   . GERD (gastroesophageal reflux disease)     EGD negative 11/04  . Fibrocystic breast disease     multiple aspirations  . IC (interstitial cystitis)   . Oral aphthae   . Solitary cyst of breast   . Enthesopathy of hip region   . Calculus in urethra   . Degeneration of lumbar or lumbosacral intervertebral disc   . Dyspepsia and other specified disorders of function of stomach   . Helicobacter pylori (H. pylori)     history   . Left ovarian cyst     x 2- pelvic ultrasound 11/2006  . Lump or mass in breast   . Diffuse cystic mastopathy     Family History  Problem Relation Age of Onset  . Alcohol abuse Father     with liver problems  . Lung cancer Father   . Liver disease Father   . Cancer Father     lung  . Breast cancer Mother 40    recurrent  . Hyperlipidemia Mother   . Cancer Mother     breast, age 76  . Colon cancer Neg Hx     Social History   Social History  . Marital Status: Married    Spouse Name: N/A  . Number of Children: 3  . Years of Education: N/A   Occupational History  . [employer]    Social History Main Topics  . Smoking status: Never Smoker   . Smokeless tobacco: Never Used  . Alcohol Use: No  . Drug Use: No  . Sexual Activity: Not on file   Other Topics Concern  . Not on file   Social History Narrative   1 caffeine drink/day      Regular exercise             Allergies  Allergen Reactions  . Amoxicillin-Pot Clavulanate      REACTION: GI  . Esomeprazole Magnesium     abd pain   . Ibuprofen Other (See Comments)  . Levofloxacin     REACTION: itching  . Nsaids     REACTION: GI upset  . Pseudoephedrine-Guaifenesin Er     REACTION: insomnia  . Shrimp [Shellfish Allergy] Nausea And Vomiting  . Sulfonamide Derivatives     REACTION: hives     Constitutional: Positive headache and fatigue. Denies fever and abrupt weight changes.  HEENT:  Positive eye pain, facial pain, ear discomfort and nasal congestion. Denies sore throat eye redness, ringing in the ears, wax buildup, or bloody nose. Respiratory: Positive cough. Denies difficulty breathing or shortness of breath.  Cardiovascular: Denies chest pain, chest tightness, palpitations or swelling in the hands or feet.   No other specific complaints in a complete review of systems (except as listed in HPI above).  Objective:  BP 98/62 mmHg  Pulse 60  Temp(Src) 97.8 F (36.6 C) (Oral)  Wt 120 lb 1.9 oz (54.486 kg)  SpO2 98%   General: Appears  her stated age, in NAD. HEENT: Head: normal shape and size, maxillary and frontal sinus tenderness noted; Eyes: sclera white, no icterus, conjunctiva pink; Ears: Tm's gray and intact, normal light reflex; Nose: mucosa boggy and moist, septum midline; Throat/Mouth: + PND. Teeth present, mucosa erythematous and moist, no exudate noted, no lesions or ulcerations noted.  Neck:  No adenopathy noted.  Cardiovascular: Normal rate and rhythm. S1,S2 noted.  No murmur, rubs or gallops noted.  Pulmonary/Chest: Normal effort and positive vesicular breath sounds. No respiratory distress. No wheezes, rales or ronchi noted.      Assessment & Plan:   Acute bacterial sinusitis  Continue Flonase as needed. eRx for Ceftin BID x 10 days eRx for Diflucan for antibiotic induced yeast infection   RTC as needed or if symptoms persist. BAITY, Robin Henson

## 2015-12-23 ENCOUNTER — Encounter: Payer: Self-pay | Admitting: Family Medicine

## 2016-03-02 ENCOUNTER — Encounter: Payer: Self-pay | Admitting: General Surgery

## 2016-03-07 ENCOUNTER — Ambulatory Visit: Payer: BLUE CROSS/BLUE SHIELD | Admitting: General Surgery

## 2016-03-08 ENCOUNTER — Other Ambulatory Visit: Payer: Self-pay | Admitting: Family Medicine

## 2016-03-09 ENCOUNTER — Encounter: Payer: Self-pay | Admitting: General Surgery

## 2016-03-09 ENCOUNTER — Ambulatory Visit (INDEPENDENT_AMBULATORY_CARE_PROVIDER_SITE_OTHER): Payer: BLUE CROSS/BLUE SHIELD | Admitting: General Surgery

## 2016-03-09 VITALS — BP 118/72 | HR 66 | Resp 12 | Ht 63.0 in | Wt 122.0 lb

## 2016-03-09 DIAGNOSIS — Z1239 Encounter for other screening for malignant neoplasm of breast: Secondary | ICD-10-CM | POA: Diagnosis not present

## 2016-03-09 NOTE — Patient Instructions (Addendum)
The patient is aware to call back for any questions or concerns. Patient will be asked to return to the office in one year with a bilateral screening mammogram. Venlafaxine tablets What is this medicine? VENLAFAXINE (VEN la fax een) is used to treat depression, anxiety and panic disorder. This medicine may be used for other purposes; ask your health care provider or pharmacist if you have questions. What should I tell my health care provider before I take this medicine? They need to know if you have any of these conditions: -bleeding disorders -glaucoma -heart disease -high blood pressure -high cholesterol -kidney disease -liver disease -low levels of sodium in the blood -mania or bipolar disorder -seizures -suicidal thoughts, plans, or attempt; a previous suicide attempt by you or a family -take medicines that treat or prevent blood clots -thyroid disease -an unusual or allergic reaction to venlafaxine, desvenlafaxine, other medicines, foods, dyes, or preservatives -pregnant or trying to get pregnant -breast-feeding How should I use this medicine? Take this medicine by mouth with a glass of water. Follow the directions on the prescription label. Take it with food. Take your medicine at regular intervals. Do not take your medicine more often than directed. Do not stop taking this medicine suddenly except upon the advice of your doctor. Stopping this medicine too quickly may cause serious side effects or your condition may worsen. A special MedGuide will be given to you by the pharmacist with each prescription and refill. Be sure to read this information carefully each time. Talk to your pediatrician regarding the use of this medicine in children. Special care may be needed. Overdosage: If you think you have taken too much of this medicine contact a poison control center or emergency room at once. NOTE: This medicine is only for you. Do not share this medicine with others. What if I miss  a dose? If you miss a dose, take it as soon as you can. If it is almost time for your next dose, take only that dose. Do not take double or extra doses. What may interact with this medicine? Do not take this medicine with any of the following medications: -certain medicines for fungal infections like fluconazole, itraconazole, ketoconazole, posaconazole, voriconazole -cisapride -desvenlafaxine -dofetilide -dronedarone -duloxetine -levomilnacipran -linezolid -MAOIs like Carbex, Eldepryl, Marplan, Nardil, and Parnate -methylene blue (injected into a vein) -milnacipran -pimozide -thioridazine -ziprasidone This medicine may also interact with the following medications: -aspirin and aspirin-like medicines -certain medicines for depression, anxiety, or psychotic disturbances -certain medicines for migraine headaches like almotriptan, eletriptan, frovatriptan, naratriptan, rizatriptan, sumatriptan, zolmitriptan -certain medicines for sleep -certain medicines that treat or prevent blood clots like dalteparin, enoxaparin, warfarin -cimetidine -clozapine -diuretics -fentanyl -furazolidone -indinavir -isoniazid -lithium -metoprolol -NSAIDS, medicines for pain and inflammation, like ibuprofen or naproxen -other medicines that prolong the QT interval (cause an abnormal heart rhythm) -procarbazine -rasagiline -supplements like St. John's wort, kava kava, valerian -tramadol -tryptophan This list may not describe all possible interactions. Give your health care provider a list of all the medicines, herbs, non-prescription drugs, or dietary supplements you use. Also tell them if you smoke, drink alcohol, or use illegal drugs. Some items may interact with your medicine. What should I watch for while using this medicine? Tell your doctor if your symptoms do not get better or if they get worse. Visit your doctor or health care professional for regular checks on your progress. Because it may  take several weeks to see the full effects of this medicine, it is important to continue  your treatment as prescribed by your doctor. Patients and their families should watch out for new or worsening thoughts of suicide or depression. Also watch out for sudden changes in feelings such as feeling anxious, agitated, panicky, irritable, hostile, aggressive, impulsive, severely restless, overly excited and hyperactive, or not being able to sleep. If this happens, especially at the beginning of treatment or after a change in dose, call your health care professional. This medicine can cause an increase in blood pressure. Check with your doctor for instructions on monitoring your blood pressure while taking this medicine. You may get drowsy or dizzy. Do not drive, use machinery, or do anything that needs mental alertness until you know how this medicine affects you. Do not stand or sit up quickly, especially if you are an older patient. This reduces the risk of dizzy or fainting spells. Alcohol may interfere with the effect of this medicine. Avoid alcoholic drinks. Your mouth may get dry. Chewing sugarless gum, sucking hard candy and drinking plenty of water will help. Contact your doctor if the problem does not go away or is severe. What side effects may I notice from receiving this medicine? Side effects that you should report to your doctor or health care professional as soon as possible: -allergic reactions like skin rash, itching or hives, swelling of the face, lips, or tongue -breathing problems -changes in vision -seizures -suicidal thoughts or other mood changes -trouble passing urine or change in the amount of urine -unusual bleeding or bruising Side effects that usually do not require medical attention (report to your doctor or health care professional if they continue or are bothersome): -change in sex drive or performance -constipation -increased sweating -loss of  appetite -nausea -tremors -weight loss This list may not describe all possible side effects. Call your doctor for medical advice about side effects. You may report side effects to FDA at 1-800-FDA-1088. Where should I keep my medicine? Keep out of the reach of children. Store at a controlled temperature between 20 and 25 degrees C (68 and 77 degrees F), in a dry place. Throw away any unused medicine after the expiration date. NOTE: This sheet is a summary. It may not cover all possible information. If you have questions about this medicine, talk to your doctor, pharmacist, or health care provider.    2016, Elsevier/Gold Standard. (2013-02-19 12:43:55)
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
## 2016-04-06 ENCOUNTER — Other Ambulatory Visit: Payer: Self-pay | Admitting: Family Medicine

## 2016-04-06 ENCOUNTER — Encounter: Payer: Self-pay | Admitting: Family Medicine

## 2016-04-06 MED ORDER — MAGIC MOUTHWASH W/LIDOCAINE: 5.0000 mL | Freq: Three times a day (TID) | ORAL | 0 refills | Status: DC | PRN

## 2016-04-06 NOTE — Telephone Encounter (Signed)
Sent pt a mychart message letting her know she needs to have a f/u or CPE scheduled before we can refill her med

## 2016-04-06 NOTE — Telephone Encounter (Signed)
See mychart message, pt hasn't been seen in over a year

## 2016-04-06 NOTE — Telephone Encounter (Signed)
Please call in to pt's pharmacy and let her know  Thanks

## 2016-04-14 MED ORDER — FLUTICASONE PROPIONATE 50 MCG/ACT NA SUSP: 2.0000 | Freq: Every day | NASAL | 0 refills | Status: DC

## 2016-04-14 NOTE — Telephone Encounter (Signed)
Rx called in 

## 2016-08-12 ENCOUNTER — Telehealth: Payer: BLUE CROSS/BLUE SHIELD | Admitting: Family

## 2016-08-12 DIAGNOSIS — J329 Chronic sinusitis, unspecified: Secondary | ICD-10-CM

## 2016-08-12 MED ORDER — DOXYCYCLINE HYCLATE 100 MG PO TABS: 100.0000 mg | ORAL_TABLET | Freq: Two times a day (BID) | ORAL | 0 refills | Status: DC

## 2016-08-12 NOTE — Progress Notes (Signed)

## 2016-08-15 ENCOUNTER — Telehealth: Payer: Self-pay | Admitting: Family Medicine

## 2016-08-15 DIAGNOSIS — Z0000 Encounter for general adult medical examination without abnormal findings: Secondary | ICD-10-CM

## 2016-08-15 DIAGNOSIS — E559 Vitamin D deficiency, unspecified: Secondary | ICD-10-CM | POA: Insufficient documentation

## 2016-08-15 NOTE — Telephone Encounter (Signed)
-----   Message from Ellamae Sia sent at 08/15/2016 12:16 PM EST ----- Regarding: Lab orders for Wednesday, 1.10.18 Patient is scheduled for CPX labs, please order future labs, Thanks , Karna Christmas

## 2016-08-17 ENCOUNTER — Other Ambulatory Visit (INDEPENDENT_AMBULATORY_CARE_PROVIDER_SITE_OTHER): Payer: BLUE CROSS/BLUE SHIELD

## 2016-08-17 DIAGNOSIS — Z0000 Encounter for general adult medical examination without abnormal findings: Secondary | ICD-10-CM

## 2016-08-17 DIAGNOSIS — E559 Vitamin D deficiency, unspecified: Secondary | ICD-10-CM

## 2016-08-17 NOTE — Addendum Note (Signed)
Addended by: Ellamae Sia on: 08/17/2016 08:36 AM   Modules accepted: Orders

## 2016-08-18 LAB — COMPREHENSIVE METABOLIC PANEL
ALK PHOS: 54 IU/L (ref 39–117)
ALT: 11 IU/L (ref 0–32)
AST: 13 IU/L (ref 0–40)
Albumin/Globulin Ratio: 1.6 (ref 1.2–2.2)
Albumin: 4.4 g/dL (ref 3.5–5.5)
BUN/Creatinine Ratio: 20 (ref 9–23)
BUN: 14 mg/dL (ref 6–24)
Bilirubin Total: 0.4 mg/dL (ref 0.0–1.2)
CALCIUM: 9.7 mg/dL (ref 8.7–10.2)
CO2: 28 mmol/L (ref 18–29)
CREATININE: 0.69 mg/dL (ref 0.57–1.00)
Chloride: 101 mmol/L (ref 96–106)
GFR calc Af Amer: 115 mL/min/{1.73_m2} (ref >59)
GFR, EST NON AFRICAN AMERICAN: 100 mL/min/{1.73_m2} (ref >59)
GLUCOSE: 82 mg/dL (ref 65–99)
Globulin, Total: 2.7 g/dL (ref 1.5–4.5)
Potassium: 4.1 mmol/L (ref 3.5–5.2)
Sodium: 145 mmol/L — ABNORMAL HIGH (ref 134–144)
Total Protein: 7.1 g/dL (ref 6.0–8.5)

## 2016-08-18 LAB — CBC WITH DIFFERENTIAL/PLATELET
BASOS ABS: 0 10*3/uL (ref 0.0–0.2)
Basos: 0 %
EOS (ABSOLUTE): 0 10*3/uL (ref 0.0–0.4)
EOS: 1 %
HEMATOCRIT: 39.8 % (ref 34.0–46.6)
HEMOGLOBIN: 13.1 g/dL (ref 11.1–15.9)
IMMATURE GRANULOCYTES: 0 %
Immature Grans (Abs): 0 10*3/uL (ref 0.0–0.1)
LYMPHS ABS: 2.5 10*3/uL (ref 0.7–3.1)
Lymphs: 55 %
MCH: 30.8 pg (ref 26.6–33.0)
MCHC: 32.9 g/dL (ref 31.5–35.7)
MCV: 93 fL (ref 79–97)
MONOCYTES: 9 %
MONOS ABS: 0.4 10*3/uL (ref 0.1–0.9)
NEUTROS PCT: 35 %
Neutrophils Absolute: 1.6 10*3/uL (ref 1.4–7.0)
Platelets: 224 10*3/uL (ref 150–379)
RBC: 4.26 x10E6/uL (ref 3.77–5.28)
RDW: 13.5 % (ref 12.3–15.4)
WBC: 4.5 10*3/uL (ref 3.4–10.8)

## 2016-08-18 LAB — LIPID PANEL
CHOL/HDL RATIO: 2.7 ratio (ref 0.0–4.4)
CHOLESTEROL TOTAL: 154 mg/dL (ref 100–199)
HDL: 57 mg/dL (ref >39)
LDL CALC: 82 mg/dL (ref 0–99)
TRIGLYCERIDES: 77 mg/dL (ref 0–149)
VLDL CHOLESTEROL CAL: 15 mg/dL (ref 5–40)

## 2016-08-18 LAB — TSH: TSH: 1.67 u[IU]/mL (ref 0.450–4.500)

## 2016-08-18 LAB — VITAMIN D 25 HYDROXY (VIT D DEFICIENCY, FRACTURES): Vit D, 25-Hydroxy: 20.5 ng/mL — ABNORMAL LOW (ref 30.0–100.0)

## 2016-08-22 ENCOUNTER — Encounter: Payer: BLUE CROSS/BLUE SHIELD | Admitting: Family Medicine

## 2016-08-22 ENCOUNTER — Encounter: Payer: Self-pay | Admitting: Family Medicine

## 2016-08-22 ENCOUNTER — Ambulatory Visit (INDEPENDENT_AMBULATORY_CARE_PROVIDER_SITE_OTHER): Payer: BLUE CROSS/BLUE SHIELD | Admitting: Family Medicine

## 2016-08-22 VITALS — BP 106/64 | HR 65 | Temp 97.5°F | Ht 63.0 in | Wt 123.2 lb

## 2016-08-22 DIAGNOSIS — Z0000 Encounter for general adult medical examination without abnormal findings: Secondary | ICD-10-CM

## 2016-08-22 DIAGNOSIS — M810 Age-related osteoporosis without current pathological fracture: Secondary | ICD-10-CM | POA: Diagnosis not present

## 2016-08-22 DIAGNOSIS — E2839 Other primary ovarian failure: Secondary | ICD-10-CM

## 2016-08-22 DIAGNOSIS — E559 Vitamin D deficiency, unspecified: Secondary | ICD-10-CM

## 2016-08-22 MED ORDER — FLUTICASONE PROPIONATE 50 MCG/ACT NA SUSP: 2.0000 | Freq: Every day | NASAL | 11 refills | Status: DC

## 2016-08-22 NOTE — Progress Notes (Signed)
Pre visit review using our clinic review tool, if applicable. No additional management support is needed unless otherwise documented below in the visit note. 
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
## 2016-08-22 NOTE — Assessment & Plan Note (Signed)
Level is 20.5 Not on D- neglected it Will start 4000 iu daily  Disc imp to bone and overall health

## 2016-08-22 NOTE — Assessment & Plan Note (Signed)
Reviewed health habits including diet and exercise and skin cancer prevention Reviewed appropriate screening tests for age  Also reviewed health mt list, fam hx and immunization status , as well as social and family history   See HPI Labs reviewed  ref for dexa- due  Disc inc D to 4000 iu daily for D def  Enc her to mt her weight

## 2016-08-22 NOTE — Assessment & Plan Note (Addendum)
Due for dexa This is ordered D is low-will get on D3 at 4000 iu daily  Does exercise  Remote hx of arm fracture  Disc need for calcium/ vitamin D/ wt bearing exercise and bone density test every 2 y to monitor Disc safety/ fracture risk in detail

## 2016-08-22 NOTE — Patient Instructions (Addendum)
For constipation -try miralax over the counter - as directed daily -then figure out how often you need it  Also try to get 64 oz of fluids a day to prevent constipation   You really need vitamin D for bone health and overall health  Get D3 and take 4000 iu daily   If you knee starts to hurt more make an appt with Dr Lorelei Pont - our sport med doctor    Take care of yourself   Stop at check out for referral for bone density test

## 2016-08-29 ENCOUNTER — Telehealth: Payer: Self-pay

## 2016-08-29 MED ORDER — DOXYCYCLINE HYCLATE 100 MG PO TABS: 100.0000 mg | ORAL_TABLET | Freq: Two times a day (BID) | ORAL | 0 refills | Status: DC

## 2016-08-29 NOTE — Telephone Encounter (Signed)
I spoke with pts husband(DPR signed) and pt was [facility] and is at work now. pts husband thinks pt is doing better. FYI to Dr Glori Bickers.

## 2016-08-29 NOTE — Telephone Encounter (Signed)
All abx can cause some nausea unfortunately- do take it with food if able  If she has not had a recent tendon problem /tear etc I would not avoid this medicine - but if she develops pain let us know and stop it  hpe she feels better soon

## 2016-08-29 NOTE — Telephone Encounter (Signed)
Pt notified of Dr. Marliss Coots comments and advise Rx sent to pharmacy

## 2016-08-29 NOTE — Telephone Encounter (Signed)
PLEASE NOTE: All timestamps contained within this report are represented as [place] Standard Time. CONFIDENTIALTY NOTICE: This fax transmission is intended only for the addressee. It contains information that is legally privileged, confidential or otherwise protected from use or disclosure. If you are not the intended recipient, you are strictly prohibited from reviewing, disclosing, copying using or disseminating any of this information or taking any action in reliance on or regarding this information. If you have received this fax in error, please notify us immediately by telephone so that we can arrange for its return to [place]. Phone: [phone], Toll-Free: [phone], Fax: [phone] Page: 1 of 2 Call Id: [id number] [place] Patient Name: Robin Henson Gender: Female DOB: April 21, 1961 Age: 56 Y 67 M 30 D Return Phone Number: [phone] (Primary), [phone] (Secondary) Address: City/State/Zip: [place] Client [facility] - Client Client Site [place] [facility], Roque Lias - MD Contact Type Call Who Is Calling Patient / Member / Family / Caregiver Call Type Triage / Clinical Relationship To Patient Self Return Phone Number [phone] (Primary) Chief Complaint Vomiting Reason for Call Symptomatic / Request for [place] is having sinus problems, now vomiting and diarrhea, fever of 101.9, feels lethargic, headache. PreDisposition Home Care Translation No Nurse Assessment Nurse: Chesley Noon, RN, Lattie Haw Date/Time Eilene Ghazi Time): 08/27/2016 11:26:44 AM Confirm and document reason for call. If symptomatic, describe symptoms. ---Caller states she has just finished antibiotics for sinus infection, she has a bad headache, runny nose and vomiting/diarrhea. Fever of 101.9. Vomited > 8 times today, diarrhea  multiple times. Ate [employer] for dinner last night. Does the patient have any new or worsening symptoms? ---Yes Will a triage be completed? ---Yes Related visit to physician within the last 2 weeks? ---No Does the PT have any chronic conditions? (i.e. diabetes, asthma, etc.) ---No Is the patient pregnant or possibly pregnant? (Ask all females between the ages of 48-55) ---No Is this a behavioral health or substance abuse call? ---No Guidelines Guideline Title Affirmed Question Affirmed Notes Nurse Date/Time (Eastern Time) Vomiting [1] SEVERE vomiting (e.g., 6 or more times/ day) AND [2] present > 8 hours Chesley Noon, RN, Lattie Haw 08/27/2016 11:27:10 AM Disp. Time Eilene Ghazi Time) Disposition Final User 08/27/2016 11:34:06 AM Go to ED Now (or PCP triage) Yes Chesley Noon, RN, Lattie Haw PLEASE NOTE: All timestamps contained within this report are represented as [place] Standard Time. CONFIDENTIALTY NOTICE: This fax transmission is intended only for the addressee. It contains information that is legally privileged, confidential or otherwise protected from use or disclosure. If you are not the intended recipient, you are strictly prohibited from reviewing, disclosing, copying using or disseminating any of this information or taking any action in reliance on or regarding this information. If you have received this fax in error, please notify us immediately by telephone so that we can arrange for its return to [place]. Phone: [phone], Toll-Free: [phone], Fax: [phone] Page: 2 of 2 Call Id: [id number] [place] Understands: Yes Disagree/Comply: Comply Care Advice Given Per Guideline GO TO ED NOW (OR PCP TRIAGE): * IF NO PCP TRIAGE: You need to be seen. Go to the [facility] at [facility] within the next hour. Leave as soon as you can. CONTAINER: You may wish to bring a bucket or container with you in case there is more vomiting during the drive. BRING MEDICINES: * Please bring a list of your current  medicines when  you go to see the doctor. * It is also a good idea to bring the pill bottles too. This will help the doctor to make certain you are taking the right medicines and the right dose. CARE ADVICE per Vomiting (Adult) guideline. Referrals [facility] - ED

## 2016-08-29 NOTE — Telephone Encounter (Signed)
We are limited in light of her medicine intolerances  Can try doxycycline  [facility] [place]

## 2016-08-29 NOTE — Telephone Encounter (Signed)
Pt left v/m; pt was seen for sinus problem at [facility] clinic and pt was given Levofloxacin 750 mg ; pt wants to know if Dr Glori Bickers thinks OK for pt to take this abx; pt has been feeling slightly nauseated and pt also concerned about possible tendon problems taking this med. Pt request cb.

## 2016-08-29 NOTE — Telephone Encounter (Signed)
Pt notified of Dr. Marliss Coots comments. Pt said she isn't taking this medication pt request an alt med sent in she said she doesn't like the way this abx makes her feel and she is to worried about the side eff, pt said she still has bad sinus sxs and is requesting another medication sent to the pharmacy

## 2016-09-05 ENCOUNTER — Ambulatory Visit
Admission: RE | Admit: 2016-09-05 | Discharge: 2016-09-05 | Disposition: A | Discharge status: Home / Self Care | Payer: BLUE CROSS/BLUE SHIELD | Source: Ambulatory Visit | Attending: Family Medicine | Admitting: Family Medicine

## 2016-09-05 DIAGNOSIS — M8588 Other specified disorders of bone density and structure, other site: Secondary | ICD-10-CM | POA: Diagnosis not present

## 2016-09-05 DIAGNOSIS — E2839 Other primary ovarian failure: Secondary | ICD-10-CM | POA: Diagnosis present

## 2016-09-05 DIAGNOSIS — M810 Age-related osteoporosis without current pathological fracture: Secondary | ICD-10-CM | POA: Diagnosis not present

## 2016-09-13 ENCOUNTER — Encounter: Payer: BLUE CROSS/BLUE SHIELD | Admitting: Family Medicine

## 2016-09-14 ENCOUNTER — Telehealth: Payer: Self-pay | Admitting: Family Medicine

## 2016-09-14 NOTE — Telephone Encounter (Signed)
Pt has appt with Allie Bossier NP on 09/15/16 at 8:45.

## 2016-09-14 NOTE — Telephone Encounter (Signed)
[place] [facility] Patient Name: Robin Henson DOB: [birth year] Initial Comment Caller states she has been having sinus problems since after Christmas, finished one last week. Her teeth hurt, scratchy sore throat, chest congestion. Body aches. Headache. Her husband has had Bronchitis. Nurse Assessment Nurse: Richardson Landry, RN, Aldona Bar Date/Time (Eastern Time): 09/14/2016 11:11:35 AM Confirm and document reason for call. If symptomatic, describe symptoms. ---Caller states she finished abx last week. Wondering if it is allergies. Caller states it is clear when coughing and blowing nose. Denies fever. Does the patient have any new or worsening symptoms? ---Yes Will a triage be completed? ---Yes Related visit to physician within the last 2 weeks? ---Yes Does the PT have any chronic conditions? (i.e. diabetes, asthma, etc.) ---No Is the patient pregnant or possibly pregnant? (Ask all females between the ages of 69-55) ---No Is this a behavioral health or substance abuse call? ---No Guidelines Guideline Title Affirmed Question Affirmed Notes Sinus Infection on Antibiotic Follow-up Call [1] Taking antibiotic > 72 hours (3 days) AND [2] sinus pain not improved Final Disposition User See Physician within 24 Hours [place], RN, [employer] Referrals REFERRED TO PCP OFFICE Disagree/Comply: Leta Baptist

## 2016-09-14 NOTE — Telephone Encounter (Signed)
Noted  

## 2016-09-15 ENCOUNTER — Encounter: Payer: Self-pay | Admitting: Primary Care

## 2016-09-15 ENCOUNTER — Ambulatory Visit (INDEPENDENT_AMBULATORY_CARE_PROVIDER_SITE_OTHER): Payer: BLUE CROSS/BLUE SHIELD | Admitting: Primary Care

## 2016-09-15 VITALS — BP 114/74 | HR 78 | Temp 98.2°F | Ht 63.0 in | Wt 124.8 lb

## 2016-09-15 DIAGNOSIS — J3489 Other specified disorders of nose and nasal sinuses: Secondary | ICD-10-CM | POA: Diagnosis not present
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
## 2016-09-15 NOTE — Progress Notes (Signed)
Pre visit review using our clinic review tool, if applicable. No additional management support is needed unless otherwise documented below in the visit note. 

## 2016-09-15 NOTE — Patient Instructions (Signed)
Nasal Congestion/Sinus Pressure: Try using Flonase (fluticasone) nasal spray. Instill 1 spray in each nostril twice daily. Do this daily for at least the next 2 weeks.  If no improvement with Flonase then fill the Prednisone prescription that was provided to you by [facility].  Please call me if no improvement within the next several weeks.  It was a pleasure meeting you!

## 2016-09-20 ENCOUNTER — Telehealth: Payer: Self-pay | Admitting: Family Medicine

## 2016-09-20 DIAGNOSIS — J329 Chronic sinusitis, unspecified: Secondary | ICD-10-CM

## 2016-09-20 NOTE — Telephone Encounter (Signed)
Noted, referral placed to ENT for further evaluation.

## 2016-09-20 NOTE — Telephone Encounter (Signed)
Pt is not getting any better and would like referral to ent.  Pt prefers [facility], afternoon, or 1st available.  cb number is [phone] Thank you

## 2016-09-21 ENCOUNTER — Encounter: Payer: Self-pay | Admitting: Primary Care

## 2016-09-21 MED ORDER — DOXYCYCLINE HYCLATE 100 MG PO TABS: 100.0000 mg | ORAL_TABLET | Freq: Two times a day (BID) | ORAL | 0 refills | Status: DC

## 2016-11-23 ENCOUNTER — Ambulatory Visit (INDEPENDENT_AMBULATORY_CARE_PROVIDER_SITE_OTHER): Payer: BLUE CROSS/BLUE SHIELD | Admitting: Podiatry

## 2016-11-23 ENCOUNTER — Encounter: Payer: Self-pay | Admitting: Podiatry

## 2016-11-23 ENCOUNTER — Ambulatory Visit (INDEPENDENT_AMBULATORY_CARE_PROVIDER_SITE_OTHER): Payer: BLUE CROSS/BLUE SHIELD

## 2016-11-23 DIAGNOSIS — M79671 Pain in right foot: Secondary | ICD-10-CM

## 2016-11-23 DIAGNOSIS — M79672 Pain in left foot: Secondary | ICD-10-CM

## 2016-11-23 DIAGNOSIS — M722 Plantar fascial fibromatosis: Secondary | ICD-10-CM

## 2016-11-23 MED ORDER — TRIAMCINOLONE ACETONIDE 10 MG/ML IJ SUSP
10.0000 mg | Freq: Once | INTRAMUSCULAR | Status: AC
  Administered 2016-11-23: 10 mg

## 2016-11-23 MED ORDER — METHYLPREDNISOLONE 4 MG PO TBPK: ORAL_TABLET | ORAL | 0 refills | Status: DC

## 2016-11-23 NOTE — Progress Notes (Signed)
Subjective:     Patient ID: Robin Henson, female   DOB: 11/12/1960, 56 y.o.   MRN: [id number]  HPI patient states she's had problems with her heels over the last few months and is developed a lot of pain in the mid arch area right with inability to walk or be comfortable. Has orthotics from the past but they are worn out and she's interested in a     Review of Systems  All other systems reviewed and are negative.      Objective:   Physical Exam  Constitutional: She is oriented to person, place, and time.  Cardiovascular: Intact distal pulses.   Musculoskeletal: Normal range of motion.  Neurological: She is oriented to person, place, and time.  Skin: Skin is warm.  Nursing note and vitals reviewed.  neurovascular status intact muscle strength adequate range of motion within normal limits with patient found to have significant discomfort in the mid arch area right with inflammation and fluid around the mid arch. The heels are also tender but not to the same degree and it is significant increase in pain right over left with depression of the arch also noted     Assessment:     Mid arch fasciitis right with plantar fasciitis bilateral of a moderate nature    Plan:     H&P x-rays reviewed all conditions discussed. At this point careful mid arch injection administered right 3 mg Kenalog 5 mill grams Xylocaine was tolerated and I dispense fascial brace is bilateral with the arch is up. I then gave instructions on physical therapy and we discussed long-term orthotics which I would like to make for her when I see that she has responded. Patient is placed on steroid Dosepak to reduce the inflammation  X-rays reveal significant spur formation with no indication of stress fracture or arthritis

## 2016-11-23 NOTE — Progress Notes (Signed)
   Subjective:    Patient ID: Robin Henson, female    DOB: 1960/10/28, 56 y.o.   MRN: [id number]  HPI Chief Complaint  Patient presents with  . Foot Pain    Bilateral; bottom of heel; arch-medial side; pt stated, "Right foot is worse; has cramping in both legs"; x4 weeks      Review of Systems  Musculoskeletal: Positive for arthralgias.  All other systems reviewed and are negative.      Objective:   Physical Exam        Assessment & Plan:

## 2016-11-23 NOTE — Patient Instructions (Signed)

## 2016-12-08 ENCOUNTER — Ambulatory Visit (INDEPENDENT_AMBULATORY_CARE_PROVIDER_SITE_OTHER): Payer: BLUE CROSS/BLUE SHIELD | Admitting: Podiatry

## 2016-12-08 ENCOUNTER — Other Ambulatory Visit: Payer: BLUE CROSS/BLUE SHIELD

## 2016-12-08 ENCOUNTER — Ambulatory Visit: Payer: BLUE CROSS/BLUE SHIELD | Admitting: Podiatry

## 2016-12-08 ENCOUNTER — Encounter: Payer: Self-pay | Admitting: Podiatry

## 2016-12-08 DIAGNOSIS — M722 Plantar fascial fibromatosis: Secondary | ICD-10-CM | POA: Diagnosis not present

## 2016-12-10 NOTE — Progress Notes (Signed)
Subjective:    Patient ID: Robin Henson, female   DOB: 56 y.o.   MRN: [id number]   HPI patient states the left foot is improved but the right heel continues to her and I know also my flatfeet and arches are part of the problem    ROS      Objective:  Physical Exam  Neurovascular status intact with patient noted to have mechanical dysfunction of the arch leading to instability inflammation and chronic discomfort of the plantar fascial band that has improved with treatment but still present    Assessment:     H&P condition reviewed and at this point I reviewed mechanical processes    Plan:  Plantar fasciitis that remains moderately tender right over left and today scanned for custom orthotics along with instructions on physical therapy shoe gear modifications anti-inflammatories

## 2016-12-29 ENCOUNTER — Other Ambulatory Visit: Payer: BLUE CROSS/BLUE SHIELD

## 2017-01-04 ENCOUNTER — Encounter: Payer: Self-pay | Admitting: Family Medicine

## 2017-01-12 ENCOUNTER — Telehealth: Payer: Self-pay | Admitting: Family Medicine

## 2017-01-12 NOTE — Telephone Encounter (Signed)
Pt will be here tomorrow @ 4:20 pm, I advise her it was okay to be 5 min late

## 2017-01-12 NOTE — Telephone Encounter (Signed)
I spoke with pt and offered multiple appts but first appt that was convenient for pts schedule was 01/17/17 at 10:45 with Dr Glori Bickers.

## 2017-01-12 NOTE — Telephone Encounter (Signed)
Patient Name: Robin Henson  DOB: 22-Jan-1961    Initial Comment 2 hard knots below under her arm.    Nurse Assessment  Nurse: Raphael Gibney, RN, Vera Date/Time (Eastern Time): 01/12/2017 2:20:14 PM  Confirm and document reason for call. If symptomatic, describe symptoms. ---Caller states she has 2 hard knots on the right side near her breast. She was bitten by a tick 2 weeks ago. knot is sore about the size of pea. No fever.  Does the patient have any new or worsening symptoms? ---Yes  Will a triage be completed? ---Yes  Related visit to physician within the last 2 weeks? ---No  Does the PT have any chronic conditions? (i.e. diabetes, asthma, etc.) ---No  Is the patient pregnant or possibly pregnant? (Ask all females between the ages of 82-55) ---No  Is this a behavioral health or substance abuse call? ---No     Guidelines    Guideline Title Affirmed Question Affirmed Notes  Skin Lump or Localized Swelling [1] Swelling is painful to touch AND [2] no fever    Final Disposition User   See Physician within 24 Hours [place], RN, Vera    Comments  no appts available at [facility] within 24 hrs. Only appts available at [facility], pt is unable to schedule due to errand she needs to do Please call pt back regarding appt.   Referrals  GO TO FACILITY REFUSED   Disagree/Comply: Disagree  Disagree/Comply Reason: Disagree with instructions

## 2017-01-12 NOTE — Telephone Encounter (Signed)
If she wants to come at 4:15 tomorrow that would be fine if she can make it

## 2017-01-13 ENCOUNTER — Encounter: Payer: Self-pay | Admitting: Family Medicine

## 2017-01-13 ENCOUNTER — Ambulatory Visit (INDEPENDENT_AMBULATORY_CARE_PROVIDER_SITE_OTHER): Payer: BLUE CROSS/BLUE SHIELD | Admitting: Family Medicine

## 2017-01-13 VITALS — BP 104/58 | HR 60 | Temp 97.5°F | Ht 63.0 in | Wt 126.0 lb

## 2017-01-13 DIAGNOSIS — M5137 Other intervertebral disc degeneration, lumbosacral region: Secondary | ICD-10-CM

## 2017-01-13 DIAGNOSIS — W57XXXA Bitten or stung by nonvenomous insect and other nonvenomous arthropods, initial encounter: Secondary | ICD-10-CM | POA: Insufficient documentation

## 2017-01-13 DIAGNOSIS — N63 Unspecified lump in unspecified breast: Secondary | ICD-10-CM | POA: Diagnosis not present
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
## 2017-01-13 NOTE — Patient Instructions (Addendum)
The knot feels like a cyst - try not to over manipulate it  Watch for redness/swelling or change in size  Keep your appointment with the surgeon  Try to minimize your caffeine   The tick bite looks ok - it if itches you can use over the counter cortisone cream  If it enlarges or takes on a bullseye shape -please alert me (or if you get a rash or fever or other symptoms)    Try flexeril for your back

## 2017-01-15 NOTE — Assessment & Plan Note (Signed)
Tiny superficial lump felt (2-3 mm) with slt tenderness in lateral chest wall at border of breast tissue area  Disc imaging- she would rather do this at her surgeon's office (her appt is upcoming)  Will avoid manipulating it further (? Causing discomfort) Otherwise nl exam  Hx of fibrocystic breasts= disc caffeine avoidance  Update if this changes/enl or skin change occurs

## 2017-01-15 NOTE — Assessment & Plan Note (Signed)
More back pain lately-disc pain control  Will try her flexeril and update strestches encouraged

## 2017-01-15 NOTE — Assessment & Plan Note (Signed)
Small bite on R side of back-no signs of erythema migrans and no other symptoms Will monitor Disc imp of insect repellent

## 2017-01-17 ENCOUNTER — Ambulatory Visit: Payer: BLUE CROSS/BLUE SHIELD | Admitting: Family Medicine

## 2017-01-18 ENCOUNTER — Ambulatory Visit: Payer: BLUE CROSS/BLUE SHIELD | Admitting: Family Medicine

## 2017-01-31 ENCOUNTER — Ambulatory Visit: Payer: BLUE CROSS/BLUE SHIELD | Admitting: General Surgery

## 2017-02-03 ENCOUNTER — Encounter: Payer: Self-pay | Admitting: Family Medicine

## 2017-02-09 ENCOUNTER — Telehealth: Payer: Self-pay | Admitting: Family Medicine

## 2017-02-10 ENCOUNTER — Telehealth: Payer: Self-pay

## 2017-02-10 NOTE — Telephone Encounter (Signed)
If my 4:15 is open- please offer that to her,thanks

## 2017-02-10 NOTE — Telephone Encounter (Signed)
Pt said she may see if goes away or may call Dr Jetty Duhamel. If pain worsens over weekend will go to UC. If still hurting next wk will call [facility] for appt. Offered pt appt today at other [facility] sites but pt declined. FYI to Dr Glori Bickers.

## 2017-02-10 NOTE — Telephone Encounter (Signed)
PLEASE NOTE: All timestamps contained within this report are represented as [place] Standard Time. CONFIDENTIALTY NOTICE: This fax transmission is intended only for the addressee. It contains information that is legally privileged, confidential or otherwise protected from use or disclosure. If you are not the intended recipient, you are strictly prohibited from reviewing, disclosing, copying using or disseminating any of this information or taking any action in reliance on or regarding this information. If you have received this fax in error, please notify us immediately by telephone so that we can arrange for its return to [place]. Phone: [phone], Toll-Free: [phone], Fax: [phone] Page: 1 of 2 Call Id: [id number] [place] Patient Name: Robin Henson Gender: Female DOB: June 29, 1961 Age: 57 Y 10 M 14 D Return Phone Number: [phone] (Primary), [phone] (Secondary) City/State/Zip: [place] [address] Client [facility] - Client Client Site [place] - Day Who Is Calling Patient / Member / Family / Caregiver Call Type Triage / Clinical Relationship To Patient Self Return Phone Number [phone] (Primary) Chief Complaint Abdominal Distention / Mass Reason for Call Symptomatic / Request for [place] is having lower back and stomach pain. Her stomach is very swollen. Does not hurt when she urinates. This all started Tuesday. Appointment Disposition EMR Appointment Not Necessary Info pasted into Epic No Nurse Assessment Nurse: Jimmey Ralph, RN, Epifanio Lesches Date/Time (Eastern Time): 02/09/2017 4:39:59 PM Confirm and document reason for call. If symptomatic, describe symptoms. ---Caller states: I am having lower back and stomach pain. Her stomach is very swollen, I look like I am 3-4 month pregnant. Does not hurt  when she urinates. This all started Tuesday. I have had trouble in the past with my urethea tube. The last time I urinated I was able to empty my bladder. My pain is 5-6 /10. The pain in my back is constant, but the bladder pain is coming and going. No fever. Does the PT have any chronic conditions? (i.e. diabetes, asthma, etc.) ---No Is the patient pregnant or possibly pregnant? (Ask all females between the ages of 72-55) ---No Guidelines Guideline Title Affirmed Question Abdominal Pain - Female [1] MILD-MODERATE pain AND [2] constant AND [3] present > 2 hours Disp. Time Eilene Ghazi Time) Disposition Final User 02/09/2017 4:48:49 PM See Physician within 4 Hours (or PCP triage) Yes Hammonds, RN, Lissa Referrals GO TO FACILITY UNDECIDED Care Advice Given Per Guideline SEE PHYSICIAN WITHIN 4 HOURS (or PCP triage): REST: Lie down and rest until seen. NOTHING BY MOUTH: Do not eat or drink anything for now. CALL BACK IF: * You become worse. CARE ADVICE given per Abdominal Pain, Female (Adult) guideline. PLEASE NOTE: All timestamps contained within this report are represented as [place] Standard Time. CONFIDENTIALTY NOTICE: This fax transmission is intended only for the addressee. It contains information that is legally privileged, confidential or otherwise protected from use or disclosure. If you are not the intended recipient, you are strictly prohibited from reviewing, disclosing, copying using or disseminating any of this information or taking any action in reliance on or regarding this information. If you have received this fax in error, please notify us immediately by telephone so that we can arrange for its return to [place]. Phone: [phone], Toll-Free: [phone], Fax: [phone] Page: 2 of 2 Call Id: [id number] Comments User: Daphene Calamity, RN Date/Time Eilene Ghazi Time): 02/09/2017 5:00:40 PM PT's MD is not on chart - called her  to get her MD's name and there is no answer User: Moses Manners, RN Date/Time Eilene Ghazi Time): 02/09/2017 5:12:07 PM This triage nurse contacted backline to attempt scheduling appointment but no appointment available before 5pm. Office stated they are closing today at [facility] (not open today til 7pm) Caller advised and disagreed with See physician in next 4hours/UC outcome, but will go if symptoms worsen per caller. This triage nurse advised strongly that caller be seen in next 4 hours and caller verbalized understanding but not agreement.

## 2017-02-10 NOTE — Telephone Encounter (Signed)
Offered pt the 4:15 appt today and pt declined. Pt said she went to the chiropractor today and they "worked on her" and she feels a little better but if her sxs don't resolve she is going to follow up with her urologist because she knows it's probably her kidneys, pt thanked Dr. Glori Bickers for offering to see her but she's okay for now

## 2017-02-10 NOTE — Telephone Encounter (Signed)
Aware, thanks  Keep me posted

## 2017-03-06 ENCOUNTER — Encounter: Payer: Self-pay | Admitting: General Surgery

## 2017-03-08 ENCOUNTER — Encounter: Payer: Self-pay | Admitting: General Surgery

## 2017-03-08 ENCOUNTER — Ambulatory Visit (INDEPENDENT_AMBULATORY_CARE_PROVIDER_SITE_OTHER): Payer: BLUE CROSS/BLUE SHIELD | Admitting: General Surgery

## 2017-03-08 VITALS — BP 108/62 | HR 74 | Resp 12 | Ht 63.0 in | Wt 125.0 lb

## 2017-03-08 DIAGNOSIS — Z1239 Encounter for other screening for malignant neoplasm of breast: Secondary | ICD-10-CM

## 2017-03-08 DIAGNOSIS — Z1231 Encounter for screening mammogram for malignant neoplasm of breast: Secondary | ICD-10-CM

## 2017-03-08 NOTE — Patient Instructions (Signed)
Patient will be asked to return to the office in one year with a bilateral screening mammogram. The patient is aware to call back for any questions or concerns. 
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
## 2017-04-13 ENCOUNTER — Encounter: Payer: Self-pay | Admitting: Podiatry

## 2017-04-13 ENCOUNTER — Ambulatory Visit (INDEPENDENT_AMBULATORY_CARE_PROVIDER_SITE_OTHER): Payer: BLUE CROSS/BLUE SHIELD | Admitting: Podiatry

## 2017-04-13 ENCOUNTER — Ambulatory Visit (INDEPENDENT_AMBULATORY_CARE_PROVIDER_SITE_OTHER): Payer: BLUE CROSS/BLUE SHIELD

## 2017-04-13 ENCOUNTER — Other Ambulatory Visit: Payer: Self-pay | Admitting: Podiatry

## 2017-04-13 DIAGNOSIS — M79672 Pain in left foot: Secondary | ICD-10-CM

## 2017-04-13 DIAGNOSIS — M722 Plantar fascial fibromatosis: Secondary | ICD-10-CM

## 2017-04-13 MED ORDER — TRIAMCINOLONE ACETONIDE 10 MG/ML IJ SUSP: 10.0000 mg | Freq: Once | INTRAMUSCULAR | Status: DC

## 2017-04-13 NOTE — Progress Notes (Signed)
Subjective:    Patient ID: Robin Henson, female   DOB: 56 y.o.   MRN: [id number]   HPI patient states she has a not in her left mid arch that's become very painful and at times her plantar fascial will become sore but only occasional    ROS      Objective:  Physical Exam neurovascular status intact with patient found to have inflammatory changes mid arch left with small nodular formation measuring about 5 x 5 mm and appears to be within plantar fascial     Assessment:    Acute plantar fasciitis with possibility for fibroma or other nodular formation     Plan:    H&P x-rays of both feet reviewed and careful injection to the mid arch area left administered 3 mg Kenalog 5 mill grams Xylocaine begin home heat ice therapy with night splint usage and reappoint if symptoms persist  X-rays indicate mild depression of the arch but no indications of calcification currently

## 2017-04-28 ENCOUNTER — Ambulatory Visit: Payer: BLUE CROSS/BLUE SHIELD | Admitting: Podiatry

## 2017-05-04 ENCOUNTER — Ambulatory Visit: Payer: BLUE CROSS/BLUE SHIELD | Admitting: Podiatry

## 2017-05-08 ENCOUNTER — Ambulatory Visit (INDEPENDENT_AMBULATORY_CARE_PROVIDER_SITE_OTHER): Payer: BLUE CROSS/BLUE SHIELD | Admitting: Podiatry

## 2017-05-08 DIAGNOSIS — M779 Enthesopathy, unspecified: Secondary | ICD-10-CM | POA: Diagnosis not present

## 2017-05-08 DIAGNOSIS — M722 Plantar fascial fibromatosis: Secondary | ICD-10-CM

## 2017-05-08 MED ORDER — TRIAMCINOLONE ACETONIDE 10 MG/ML IJ SUSP
10.0000 mg | Freq: Once | INTRAMUSCULAR | Status: AC
  Administered 2017-05-08: 10 mg

## 2017-05-08 NOTE — Progress Notes (Signed)
Subjective:    Patient ID: Robin Henson, female   DOB: 56 y.o.   MRN: [id number]   HPI patient presents stating the arch seems better on my left but I'm getting a lot of pain in my forefoot as I been more active. The orthotics are helping but I'm still having a lot of pain    ROS      Objective:  Physical Exam neurovascular status intact with patient's fascia left improving from previous but I did note that there is quite a bit of discomfort in the metatarsophalangeal joint mostly around the fourth left with mild to moderate discomfort in the third     Assessment:  Improving from fasciitis left but still present bilateral with distal capsulitis fourth MPJ left       Plan:    H&P condition reviewed and at this point I did discuss both conditions separately and for the fasciitis I've recommended continued physical therapy anti-inflammatories orthotic usage and supportive shoes. For the forefoot I did do a proximal block allowed to get numb them and under sterile conditions aspirated the fourth MPJ getting out of small amount of blood indicating that was probably some trauma within the joint and injected with a quarter cc dexamethasone Kenalog and applied thick plantar padding. Reappoint 4 weeks or earlier if needed

## 2017-07-02 ENCOUNTER — Other Ambulatory Visit: Payer: Self-pay | Admitting: Family Medicine

## 2017-07-07 ENCOUNTER — Ambulatory Visit: Payer: Self-pay | Admitting: *Deleted

## 2017-07-07 NOTE — Telephone Encounter (Signed)
  Reason for Disposition . [1] Sinus congestion as part of a cold AND [2] present < 10 days  Answer Assessment - Initial Assessment Questions 1. LOCATION: "Where does it hurt?"     Chest burning intermittently 2. ONSET: "When did the sinus pain start?"  (e.g., hours, days)      Tuesday morning 3. SEVERITY: "How bad is the pain?"   (Scale 1-10; mild, moderate or severe)   - MILD (1-3): doesn't interfere with normal activities    - MODERATE (4-7): interferes with normal activities (e.g., work or school) or awakens from sleep   - SEVERE (8-10): excruciating pain and patient unable to do any normal activities        No pain 4. RECURRENT SYMPTOM: "Have you ever had sinus problems before?" If so, ask: "When was the last time?" and "What happened that time?"      Yes, last year 5. NASAL CONGESTION: "Is the nose blocked?" If so, ask, "Can you open it or must you breathe through the mouth?"     No 6. NASAL DISCHARGE: "Do you have discharge from your nose?" If so ask, "What color?"     No 7. FEVER: "Do you have a fever?" If so, ask: "What is it, how was it measured, and when did it start?"      No 8. OTHER SYMPTOMS: "Do you have any other symptoms?" (e.g., sore throat, cough, earache, difficulty breathing)     Sore throat, cough 9. PREGNANCY: "Is there any chance you are pregnant?" "When was your last menstrual period?"    No  Protocols used: SINUS PAIN OR CONGESTION-A-AH

## 2017-11-19 ENCOUNTER — Telehealth: Payer: Self-pay | Admitting: Family Medicine

## 2017-11-19 DIAGNOSIS — E559 Vitamin D deficiency, unspecified: Secondary | ICD-10-CM

## 2017-11-19 DIAGNOSIS — M810 Age-related osteoporosis without current pathological fracture: Secondary | ICD-10-CM

## 2017-11-19 DIAGNOSIS — Z0000 Encounter for general adult medical examination without abnormal findings: Secondary | ICD-10-CM

## 2017-11-19 NOTE — Telephone Encounter (Signed)
-----   Message from Lendon Collar, RT sent at 11/15/2017 10:32 AM EDT ----- Regarding: Lab appt 11/23/17 Please enter lab orders for Thursday 11/23/17. Thanks-Lauren

## 2017-11-23 ENCOUNTER — Other Ambulatory Visit (INDEPENDENT_AMBULATORY_CARE_PROVIDER_SITE_OTHER): Payer: BLUE CROSS/BLUE SHIELD

## 2017-11-23 DIAGNOSIS — Z0000 Encounter for general adult medical examination without abnormal findings: Secondary | ICD-10-CM

## 2017-11-23 DIAGNOSIS — E559 Vitamin D deficiency, unspecified: Secondary | ICD-10-CM

## 2017-11-23 NOTE — Addendum Note (Signed)
Addended by: Ellamae Sia on: 11/23/2017 09:15 AM   Modules accepted: Orders

## 2017-11-24 LAB — COMPREHENSIVE METABOLIC PANEL
ALBUMIN: 4.6 g/dL (ref 3.5–5.5)
ALK PHOS: 58 IU/L (ref 39–117)
ALT: 13 IU/L (ref 0–32)
AST: 19 IU/L (ref 0–40)
Albumin/Globulin Ratio: 1.8 (ref 1.2–2.2)
BILIRUBIN TOTAL: 0.3 mg/dL (ref 0.0–1.2)
BUN / CREAT RATIO: 22 (ref 9–23)
BUN: 14 mg/dL (ref 6–24)
CHLORIDE: 105 mmol/L (ref 96–106)
CO2: 27 mmol/L (ref 20–29)
Calcium: 9.7 mg/dL (ref 8.7–10.2)
Creatinine, Ser: 0.65 mg/dL (ref 0.57–1.00)
GFR calc Af Amer: 116 mL/min/{1.73_m2} (ref >59)
GFR calc non Af Amer: 101 mL/min/{1.73_m2} (ref >59)
GLUCOSE: 89 mg/dL (ref 65–99)
Globulin, Total: 2.6 g/dL (ref 1.5–4.5)
Potassium: 4.3 mmol/L (ref 3.5–5.2)
Sodium: 146 mmol/L — ABNORMAL HIGH (ref 134–144)
Total Protein: 7.2 g/dL (ref 6.0–8.5)

## 2017-11-24 LAB — CBC WITH DIFFERENTIAL/PLATELET
BASOS ABS: 0 10*3/uL (ref 0.0–0.2)
Basos: 0 %
EOS (ABSOLUTE): 0 10*3/uL (ref 0.0–0.4)
Eos: 1 %
Hematocrit: 41.2 % (ref 34.0–46.6)
Hemoglobin: 13.9 g/dL (ref 11.1–15.9)
Immature Grans (Abs): 0 10*3/uL (ref 0.0–0.1)
Immature Granulocytes: 0 %
LYMPHS ABS: 1.8 10*3/uL (ref 0.7–3.1)
Lymphs: 46 %
MCH: 31.4 pg (ref 26.6–33.0)
MCHC: 33.7 g/dL (ref 31.5–35.7)
MCV: 93 fL (ref 79–97)
Monocytes Absolute: 0.3 10*3/uL (ref 0.1–0.9)
Monocytes: 9 %
NEUTROS ABS: 1.7 10*3/uL (ref 1.4–7.0)
Neutrophils: 44 %
PLATELETS: 201 10*3/uL (ref 150–379)
RBC: 4.43 x10E6/uL (ref 3.77–5.28)
RDW: 13.4 % (ref 12.3–15.4)
WBC: 3.9 10*3/uL (ref 3.4–10.8)

## 2017-11-24 LAB — LIPID PANEL
CHOLESTEROL TOTAL: 175 mg/dL (ref 100–199)
Chol/HDL Ratio: 2.7 ratio (ref 0.0–4.4)
HDL: 66 mg/dL (ref >39)
LDL Calculated: 97 mg/dL (ref 0–99)
TRIGLYCERIDES: 58 mg/dL (ref 0–149)
VLDL CHOLESTEROL CAL: 12 mg/dL (ref 5–40)

## 2017-11-24 LAB — VITAMIN D 25 HYDROXY (VIT D DEFICIENCY, FRACTURES): VIT D 25 HYDROXY: 28.1 ng/mL — AB (ref 30.0–100.0)

## 2017-11-24 LAB — TSH: TSH: 1.49 u[IU]/mL (ref 0.450–4.500)

## 2017-12-04 ENCOUNTER — Ambulatory Visit (INDEPENDENT_AMBULATORY_CARE_PROVIDER_SITE_OTHER): Payer: BLUE CROSS/BLUE SHIELD | Admitting: Family Medicine

## 2017-12-04 ENCOUNTER — Encounter: Payer: Self-pay | Admitting: Family Medicine

## 2017-12-04 VITALS — BP 104/62 | HR 64 | Temp 97.8°F | Ht 62.75 in | Wt 127.2 lb

## 2017-12-04 DIAGNOSIS — E559 Vitamin D deficiency, unspecified: Secondary | ICD-10-CM | POA: Diagnosis not present

## 2017-12-04 DIAGNOSIS — Z114 Encounter for screening for human immunodeficiency virus [HIV]: Secondary | ICD-10-CM | POA: Diagnosis not present

## 2017-12-04 DIAGNOSIS — M810 Age-related osteoporosis without current pathological fracture: Secondary | ICD-10-CM | POA: Diagnosis not present

## 2017-12-04 DIAGNOSIS — Z0000 Encounter for general adult medical examination without abnormal findings: Secondary | ICD-10-CM

## 2017-12-04 DIAGNOSIS — Z1159 Encounter for screening for other viral diseases: Secondary | ICD-10-CM | POA: Diagnosis not present

## 2017-12-04 MED ORDER — FLUTICASONE PROPIONATE 50 MCG/ACT NA SUSP: 2.0000 | Freq: Every day | NASAL | 11 refills | Status: DC

## 2017-12-04 MED ORDER — ERGOCALCIFEROL 1.25 MG (50000 UT) PO CAPS: 50000.0000 [IU] | ORAL_CAPSULE | ORAL | 0 refills | Status: DC

## 2017-12-04 NOTE — Patient Instructions (Addendum)
Get vitamin D3 over the counter and take 2000 iu daily  Take 12 weeks of the high dose weekly ergocalciferol  This is so important for osteoporosis   If you are interested in the new shingles vaccine (Shingrix) - call your local pharmacy to check on coverage and availability  If affordable get on a wait list   Take care of yourself  Keep exercising   Let's screen you for hepatitis C and HIV

## 2017-12-04 NOTE — Assessment & Plan Note (Signed)
Blood transfusion in 1979 Otherwise no risk factors  HIV screen today

## 2017-12-04 NOTE — Assessment & Plan Note (Signed)
Rev last dexa -next due 08/2018 Disc need for calcium/ vitamin D/ wt bearing exercise and bone density test every 2 y to monitor Disc safety/ fracture risk in detail   Low D- will tx with high dose weekly D2 and adv to get 2000 iu daily of D3 No falls or fx since last eval

## 2017-12-04 NOTE — Assessment & Plan Note (Signed)
Pt has had blood transfusion in 1979 Otherwise on risk factors Hep C screen today

## 2017-12-04 NOTE — Assessment & Plan Note (Signed)
Px ergocalciferol high dose weekly tx for 12 weeks 2000 iu D3 daily indefinitely Disc imp to bone and overall health

## 2017-12-04 NOTE — Assessment & Plan Note (Signed)
Reviewed health habits including diet and exercise and skin cancer prevention Reviewed appropriate screening tests for age  Also reviewed health mt list, fam hx and immunization status , as well as social and family history   Disc shingrix vaccine Also imp of vit D for bone health  Good exercise  Labs reviewed  Screen for HIV and hep C today
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
## 2017-12-05 LAB — HIV ANTIBODY (ROUTINE TESTING W REFLEX): HIV Screen 4th Generation wRfx: NONREACTIVE

## 2017-12-05 LAB — HEPATITIS C ANTIBODY: Hep C Virus Ab: <0.1 s/co ratio (ref 0.0–0.9)

## 2018-02-22 ENCOUNTER — Other Ambulatory Visit: Payer: Self-pay | Admitting: *Deleted

## 2018-02-22 NOTE — Telephone Encounter (Signed)
Tell her/them she is done with the 12 week course  Now continue her daily vitamin D3   2000 iu daily

## 2018-02-22 NOTE — Telephone Encounter (Signed)
Faxed refill request. Vitamin D2 1.25mg  Last office visit:   12/04/17 Last Filled:    12 capsule 0 12/04/2017  Please advise.

## 2018-02-23 NOTE — Telephone Encounter (Signed)
Pt informed of below.  

## 2018-03-15 ENCOUNTER — Encounter: Payer: Self-pay | Admitting: General Surgery

## 2018-03-20 ENCOUNTER — Ambulatory Visit: Payer: BLUE CROSS/BLUE SHIELD | Admitting: General Surgery

## 2018-03-27 ENCOUNTER — Ambulatory Visit: Payer: BLUE CROSS/BLUE SHIELD | Admitting: General Surgery

## 2018-03-27 ENCOUNTER — Encounter: Payer: Self-pay | Admitting: General Surgery

## 2018-03-27 VITALS — BP 122/78 | HR 72 | Resp 12 | Ht 63.0 in | Wt 126.0 lb

## 2018-03-27 DIAGNOSIS — Z1239 Encounter for other screening for malignant neoplasm of breast: Secondary | ICD-10-CM

## 2018-03-27 DIAGNOSIS — Z1231 Encounter for screening mammogram for malignant neoplasm of breast: Secondary | ICD-10-CM

## 2018-03-27 NOTE — Patient Instructions (Signed)
Patient will be asked to return to the office in one year with a bilateral screening mammogram. The patient is aware to call back for any questions or concerns. 
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
## 2018-04-06 ENCOUNTER — Telehealth: Payer: Self-pay | Admitting: *Deleted

## 2018-04-06 NOTE — Telephone Encounter (Signed)
-----   Message from Robert Bellow, MD sent at 03/28/2018  5:24 PM EDT ----- Please notify the patient that I reviewed her mother's genetic testing.  The patient does not need to undergo testing.  Her mother showed no known mutations.

## 2018-04-06 NOTE — Telephone Encounter (Signed)
Notified patient as instructed, patient pleased. Discussed follow-up appointments, patient agrees  

## 2018-05-15 ENCOUNTER — Telehealth: Payer: Self-pay | Admitting: *Deleted

## 2018-05-15 NOTE — Telephone Encounter (Signed)
Pt calling back and states to call back at [phone].  She is wondering if she has to be away from her twin grandchildren for 12-25 days? She was around her grandson 05/09/18, diagnosed on 05/10/18 with mumps and strep throat, and again on 05/13/18 but he no longer had swelling.

## 2018-05-15 NOTE — Telephone Encounter (Signed)
She should get an MMR booster  Avoid them if you develop any symptoms at all and keep me posted  If she is too nervous about that- then just avoid them for the incubation period  She likely will not get mumps - but caution is a good idea

## 2018-05-15 NOTE — Telephone Encounter (Signed)
Copied from [place] [id number]. Topic: Inquiry >> May 15, 2018  9:04 AM Scherrie Gerlach wrote: Reason for CRM: pt's grandson, who lives with her, (2 1/2 yrs) was Dx with the mumps this past Thursday 10/3 after a ED visit.  Pt wants to know if she needs to be tested? Pt is not having any symptoms. Pt states she also has 16 mo old twin grand children and wants to know how long she should stay away from them?  The hospital advised her not to be around them, but did not advise how long. Please advise.

## 2018-05-15 NOTE — Telephone Encounter (Signed)
The incubation period is 12-25 days from day exposed (unsure what day she was exposed)  She should be immunized but it would not hurt to come in and get MMR vaccine  Avoid people if she gets symptoms (and let us know) 

## 2018-05-16 NOTE — Telephone Encounter (Signed)
Pt notified of Dr. Marliss Coots comments. Pt will hold off on getting MMR booster because the infectious control center called her and told her they don't think grandson has illness so she is waiting to hear back from them to see what they think before she gets the vaccine or request any follow up labs

## 2019-01-31 ENCOUNTER — Telehealth: Payer: Self-pay | Admitting: Family Medicine

## 2019-01-31 DIAGNOSIS — R5382 Chronic fatigue, unspecified: Secondary | ICD-10-CM

## 2019-01-31 DIAGNOSIS — M810 Age-related osteoporosis without current pathological fracture: Secondary | ICD-10-CM

## 2019-01-31 DIAGNOSIS — E559 Vitamin D deficiency, unspecified: Secondary | ICD-10-CM

## 2019-01-31 DIAGNOSIS — R5383 Other fatigue: Secondary | ICD-10-CM | POA: Insufficient documentation

## 2019-01-31 DIAGNOSIS — Z0000 Encounter for general adult medical examination without abnormal findings: Secondary | ICD-10-CM

## 2019-01-31 NOTE — Telephone Encounter (Signed)
Patient called to schedule CPE and labs She would like to know if her b12 should be checked at the lab visit. Patient stated she does not have a lot of energy and not sure what could be causing this .

## 2019-02-05 ENCOUNTER — Ambulatory Visit: Payer: BC Managed Care – PPO | Admitting: Family Medicine

## 2019-02-05 ENCOUNTER — Other Ambulatory Visit: Payer: Self-pay

## 2019-02-15 ENCOUNTER — Other Ambulatory Visit (INDEPENDENT_AMBULATORY_CARE_PROVIDER_SITE_OTHER): Payer: BC Managed Care – PPO

## 2019-02-15 DIAGNOSIS — Z0000 Encounter for general adult medical examination without abnormal findings: Secondary | ICD-10-CM

## 2019-02-15 DIAGNOSIS — R5382 Chronic fatigue, unspecified: Secondary | ICD-10-CM

## 2019-02-15 DIAGNOSIS — M810 Age-related osteoporosis without current pathological fracture: Secondary | ICD-10-CM

## 2019-02-15 DIAGNOSIS — E559 Vitamin D deficiency, unspecified: Secondary | ICD-10-CM

## 2019-02-15 NOTE — Addendum Note (Signed)
Addended by: Ellamae Sia on: 02/15/2019 08:52 AM   Modules accepted: Orders

## 2019-02-16 LAB — COMPREHENSIVE METABOLIC PANEL
ALT: 15 IU/L (ref 0–32)
AST: 19 IU/L (ref 0–40)
Albumin/Globulin Ratio: 2 (ref 1.2–2.2)
Albumin: 4.5 g/dL (ref 3.8–4.9)
Alkaline Phosphatase: 54 IU/L (ref 39–117)
BUN/Creatinine Ratio: 16 (ref 9–23)
BUN: 10 mg/dL (ref 6–24)
Bilirubin Total: 0.4 mg/dL (ref 0.0–1.2)
CO2: 26 mmol/L (ref 20–29)
Calcium: 9.6 mg/dL (ref 8.7–10.2)
Chloride: 103 mmol/L (ref 96–106)
Creatinine, Ser: 0.63 mg/dL (ref 0.57–1.00)
GFR calc Af Amer: 117 mL/min/{1.73_m2} (ref >59)
GFR calc non Af Amer: 101 mL/min/{1.73_m2} (ref >59)
Globulin, Total: 2.2 g/dL (ref 1.5–4.5)
Glucose: 87 mg/dL (ref 65–99)
Potassium: 4.1 mmol/L (ref 3.5–5.2)
Sodium: 143 mmol/L (ref 134–144)
Total Protein: 6.7 g/dL (ref 6.0–8.5)

## 2019-02-16 LAB — VITAMIN D 25 HYDROXY (VIT D DEFICIENCY, FRACTURES): Vit D, 25-Hydroxy: 27.9 ng/mL — ABNORMAL LOW (ref 30.0–100.0)

## 2019-02-16 LAB — CBC WITH DIFFERENTIAL/PLATELET
Basophils Absolute: 0 10*3/uL (ref 0.0–0.2)
Basos: 1 %
EOS (ABSOLUTE): 0 10*3/uL (ref 0.0–0.4)
Eos: 1 %
Hematocrit: 39.2 % (ref 34.0–46.6)
Hemoglobin: 13.2 g/dL (ref 11.1–15.9)
Immature Grans (Abs): 0 10*3/uL (ref 0.0–0.1)
Immature Granulocytes: 0 %
Lymphocytes Absolute: 1.9 10*3/uL (ref 0.7–3.1)
Lymphs: 45 %
MCH: 31 pg (ref 26.6–33.0)
MCHC: 33.7 g/dL (ref 31.5–35.7)
MCV: 92 fL (ref 79–97)
Monocytes Absolute: 0.4 10*3/uL (ref 0.1–0.9)
Monocytes: 9 %
Neutrophils Absolute: 1.8 10*3/uL (ref 1.4–7.0)
Neutrophils: 44 %
Platelets: 190 10*3/uL (ref 150–450)
RBC: 4.26 x10E6/uL (ref 3.77–5.28)
RDW: 12.6 % (ref 11.7–15.4)
WBC: 4.1 10*3/uL (ref 3.4–10.8)

## 2019-02-16 LAB — LIPID PANEL
Chol/HDL Ratio: 2.4 ratio (ref 0.0–4.4)
Cholesterol, Total: 158 mg/dL (ref 100–199)
HDL: 66 mg/dL (ref >39)
LDL Calculated: 81 mg/dL (ref 0–99)
Triglycerides: 53 mg/dL (ref 0–149)
VLDL Cholesterol Cal: 11 mg/dL (ref 5–40)

## 2019-02-16 LAB — VITAMIN B12: Vitamin B-12: 188 pg/mL — ABNORMAL LOW (ref 232–1245)

## 2019-02-16 LAB — TSH: TSH: 1.77 u[IU]/mL (ref 0.450–4.500)

## 2019-02-20 ENCOUNTER — Encounter: Payer: Self-pay | Admitting: Family Medicine

## 2019-02-20 ENCOUNTER — Other Ambulatory Visit: Payer: Self-pay

## 2019-02-20 ENCOUNTER — Ambulatory Visit (INDEPENDENT_AMBULATORY_CARE_PROVIDER_SITE_OTHER): Payer: BC Managed Care – PPO | Admitting: Family Medicine

## 2019-02-20 VITALS — BP 106/68 | HR 88 | Temp 97.2°F | Ht 63.0 in | Wt 123.4 lb

## 2019-02-20 DIAGNOSIS — E559 Vitamin D deficiency, unspecified: Secondary | ICD-10-CM

## 2019-02-20 DIAGNOSIS — Z1239 Encounter for other screening for malignant neoplasm of breast: Secondary | ICD-10-CM

## 2019-02-20 DIAGNOSIS — Z0000 Encounter for general adult medical examination without abnormal findings: Secondary | ICD-10-CM | POA: Diagnosis not present

## 2019-02-20 DIAGNOSIS — Z85820 Personal history of malignant melanoma of skin: Secondary | ICD-10-CM | POA: Diagnosis not present

## 2019-02-20 DIAGNOSIS — E538 Deficiency of other specified B group vitamins: Secondary | ICD-10-CM | POA: Diagnosis not present

## 2019-02-20 DIAGNOSIS — E2839 Other primary ovarian failure: Secondary | ICD-10-CM

## 2019-02-20 DIAGNOSIS — M810 Age-related osteoporosis without current pathological fracture: Secondary | ICD-10-CM

## 2019-02-20 MED ORDER — CYANOCOBALAMIN 1000 MCG/ML IJ SOLN
1000.0000 ug | Freq: Once | INTRAMUSCULAR | Status: AC
  Administered 2019-02-20: 1000 ug via INTRAMUSCULAR

## 2019-02-20 MED ORDER — FLUTICASONE PROPIONATE 50 MCG/ACT NA SUSP: 2.0000 | Freq: Every day | NASAL | 11 refills | Status: DC

## 2019-02-20 NOTE — Assessment & Plan Note (Signed)
Up to date with derm f/u  No new lesions Uses sun protection

## 2019-02-20 NOTE — Assessment & Plan Note (Addendum)
B12 shot today  Start 1000 mcg over the counter po daily  Should notice imp in energy level F/u labs scheduled

## 2019-02-20 NOTE — Assessment & Plan Note (Signed)
Reviewed health habits including diet and exercise and skin cancer prevention Reviewed appropriate screening tests for age  Also reviewed health mt list, fam hx and immunization status , as well as social and family history   See HPI Labs reviewed  Will tx low B12 dexa ordered Continue breast cancer evaluation/screening  Good lipid profile Enc flu shot in the fall  Meditation suggested for stress reaction /rev PHQ inst to inc her vit D to 5000 iu dialy

## 2019-02-20 NOTE — Patient Instructions (Addendum)
If you are interested in the new shingles vaccine (Shingrix) - call your local pharmacy to check on coverage and availability  If affordable, get on a wait list at your pharmacy to get the vaccine. We can also put you on a wait list here- just call   Keep exercising-walking or indoors at home   Get a flu shot in the fall   The office will call you to set up a bone density test   Get vitamin D3 over the counter and take 5000 iu daily  Put it in a pill box   B12 is also low  B12 shot today  Get vitamin B12 1000 mcg over the counter and take it daily  We will re check B12 level in 2-3 months   Meditation is good for mood /general health Check out the apps for phone  Calm   Headspace

## 2019-02-20 NOTE — Assessment & Plan Note (Signed)
Level still in 20s in pt with OP Disc imp to bone and overall health  inst to take 5000 iu daily D3 otc
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
## 2019-02-20 NOTE — Assessment & Plan Note (Signed)
With fam hx of breast cancer in mother at young age She sees Dr Bary Castilla yearly for exam  utd mammogram and self exam

## 2019-02-20 NOTE — Assessment & Plan Note (Signed)
Due for dexa-ordered  No falls or fx Disc need for calcium/ vitamin D/ wt bearing exercise and bone density test every 2 y to monitor Disc safety/ fracture risk in detail   D level is low-inst to inc intake to 5000 iu daily  Also exercise

## 2019-02-28 ENCOUNTER — Other Ambulatory Visit: Payer: Self-pay

## 2019-02-28 ENCOUNTER — Encounter: Payer: Self-pay | Admitting: Podiatry

## 2019-02-28 ENCOUNTER — Ambulatory Visit: Payer: BC Managed Care – PPO | Admitting: Podiatry

## 2019-02-28 VITALS — Temp 98.1°F

## 2019-02-28 DIAGNOSIS — M722 Plantar fascial fibromatosis: Secondary | ICD-10-CM | POA: Diagnosis not present

## 2019-03-01 NOTE — Progress Notes (Signed)
Subjective:   Patient ID: Robin Henson, female   DOB: 58 y.o.   MRN: [id number]   HPI Patient states overall doing pretty well but having trouble with the orthotics and know I need a new pair    ROS      Objective:  Physical Exam  Neurovascular status intact with patient's feet overall doing pretty well with mild discomfort and orthotics which are starting to flatten with reduce ability to hold up her arch     Assessment:  Inflammatory capsulitis with reduction of cushioning mechanism     Plan:  H&P reviewed new orthotics and at this time casted for new orthotic devices by ped orthotist.  Patient will be seen back when ready

## 2019-03-07 ENCOUNTER — Encounter: Payer: Self-pay | Admitting: Family Medicine

## 2019-03-07 ENCOUNTER — Other Ambulatory Visit: Payer: Self-pay | Admitting: Family Medicine

## 2019-03-07 ENCOUNTER — Telehealth: Payer: Self-pay | Admitting: Family Medicine

## 2019-03-07 DIAGNOSIS — Z20828 Contact with and (suspected) exposure to other viral communicable diseases: Secondary | ICD-10-CM

## 2019-03-07 DIAGNOSIS — Z20822 Contact with and (suspected) exposure to covid-19: Secondary | ICD-10-CM

## 2019-03-07 NOTE — Telephone Encounter (Signed)
Pt notified order is in and testing address given to pt
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
## 2019-03-07 NOTE — Telephone Encounter (Signed)
I ordered the test Please call her re: where to go

## 2019-03-10 LAB — NOVEL CORONAVIRUS, NAA: SARS-CoV-2, NAA: NOT DETECTED

## 2019-03-21 ENCOUNTER — Other Ambulatory Visit: Payer: Self-pay

## 2019-03-21 ENCOUNTER — Ambulatory Visit: Payer: BC Managed Care – PPO | Admitting: Orthotics

## 2019-03-21 DIAGNOSIS — M779 Enthesopathy, unspecified: Secondary | ICD-10-CM

## 2019-03-21 DIAGNOSIS — M722 Plantar fascial fibromatosis: Secondary | ICD-10-CM

## 2019-03-21 NOTE — Progress Notes (Signed)
Patient came in today to pick up custom made foot orthotics.  The goals were accomplished and the patient reported no dissatisfaction with said orthotics.  Patient was advised of breakin period and how to report any issues. 

## 2019-03-26 ENCOUNTER — Ambulatory Visit: Payer: BC Managed Care – PPO | Admitting: Surgery

## 2019-03-29 ENCOUNTER — Ambulatory Visit: Payer: BC Managed Care – PPO | Admitting: Family Medicine

## 2019-03-29 ENCOUNTER — Encounter: Payer: Self-pay | Admitting: Family Medicine

## 2019-03-29 ENCOUNTER — Other Ambulatory Visit: Payer: Self-pay

## 2019-03-29 VITALS — BP 120/80 | HR 61 | Temp 98.1°F | Ht 63.0 in | Wt 125.2 lb

## 2019-03-29 DIAGNOSIS — R21 Rash and other nonspecific skin eruption: Secondary | ICD-10-CM | POA: Diagnosis not present

## 2019-03-29 MED ORDER — TRIAMCINOLONE ACETONIDE 0.1 % EX CREA: 1.0000 "application " | TOPICAL_CREAM | Freq: Two times a day (BID) | CUTANEOUS | 0 refills | Status: DC

## 2019-03-29 NOTE — Patient Instructions (Signed)
I don't think this is shingles as the rash is on both sides of the back. Possible bug bite. Treat with steroid cream sent to pharmacy to irritated spots. Let us know if spreading rash, or new blisters developing.

## 2019-03-29 NOTE — Assessment & Plan Note (Signed)
Doubt shingles as crosses midline. Will Rx triamcinolone cream PRN. Reviewed red flags to monitor and update [place] on Monday - ie spreading rash, blisters developing, etc

## 2019-03-29 NOTE — Progress Notes (Signed)
This visit was conducted in person.  BP 120/80 (BP Location: Left Arm, Patient Position: Sitting, Cuff Size: Normal)   Pulse 61   Temp 98.1 F (36.7 C) (Temporal)   Ht 5\' 3"  (1.6 m)   Wt 125 lb 3 oz (56.8 kg)   SpO2 99%   BMI 22.18 kg/m    CC: rash Subjective:    Patient ID: Robin Henson, female    DOB: Aug 21, 1960, 58 y.o.   MRN: [id number]  HPI: Robin Henson is a 58 y.o. female presenting on 03/29/2019 for Rash (C/o rash on bilateral mid back. Areas have minor burning and back feels tingly.  Noticed today. )   Today [birth year] her birthday!  1d h/o rash along back itchy and tender, tingling/burning spots.  Hasn't tried anything for this yet.  No h/o shingles in the past.   No new detergents, lotions, soaps or shampoos.  No new food, medicines, supplements.  She did start vitamins D3 and B12 for the last few weeks.     Relevant past medical, surgical, family and social history reviewed and updated as indicated. Interim medical history since our last visit reviewed. Allergies and medications reviewed and updated. Outpatient Medications Prior to Visit  Medication Sig Dispense Refill  . Acetaminophen (TYLENOL ARTHRITIS PAIN PO) Take 2 tablets by mouth as needed.    . cholecalciferol (VITAMIN D3) 25 MCG (1000 UT) tablet Take 5,000 Units by mouth daily.    . Cyanocobalamin (VITAMIN B12 PO) Take by mouth daily.    . fluticasone (FLONASE) 50 MCG/ACT nasal spray Place 2 sprays into both nostrils daily. Needs an appt with provider before future refills are given 16 g 11   Facility-Administered Medications Prior to Visit  Medication Dose Route Frequency Provider Last Rate Last Dose  . triamcinolone acetonide (KENALOG) 10 MG/ML injection 10 mg  10 mg Other Once Wallene Huh, DPM         Per HPI unless specifically indicated in ROS section below Review of Systems Objective:    BP 120/80 (BP Location: Left Arm, Patient Position: Sitting, Cuff Size: Normal)   Pulse 61   Temp  98.1 F (36.7 C) (Temporal)   Ht 5\' 3"  (1.6 m)   Wt 125 lb 3 oz (56.8 kg)   SpO2 99%   BMI 22.18 kg/m   Wt Readings from Last 3 Encounters:  03/29/19 125 lb 3 oz (56.8 kg)  02/20/19 123 lb 6 oz (56 kg)  03/27/18 126 lb (57.2 kg)    Physical Exam Vitals signs and nursing note reviewed.  Constitutional:      Appearance: Normal appearance. She is not ill-appearing.  Skin:    General: Skin is warm and dry.     Findings: Erythema and rash present.     Comments: 2 tender slightly indurated erythematous areas on upper back lateral to bilateral scapulas, non vesicular  Neurological:     Mental Status: She is alert.  Psychiatric:        Mood and Affect: Mood normal.       Assessment & Plan:   Problem List Items Addressed This Visit    Skin rash - Primary    Doubt shingles as crosses midline. Will Rx triamcinolone cream PRN. Reviewed red flags to monitor and update [place] on Monday - ie spreading rash, blisters developing, etc          Meds ordered this encounter  Medications  . triamcinolone cream (KENALOG) 0.1 %    Sig:  Apply 1 application topically 2 (two) times daily. Apply to AA.    Dispense:  30 g    Refill:  0   No orders of the defined types were placed in this encounter.   Follow up plan: No follow-ups on file.  Ria Bush, MD

## 2019-04-01 ENCOUNTER — Other Ambulatory Visit: Payer: Self-pay

## 2019-04-01 ENCOUNTER — Encounter: Payer: Self-pay | Admitting: Surgery

## 2019-04-01 ENCOUNTER — Ambulatory Visit: Payer: BC Managed Care – PPO | Admitting: Family Medicine

## 2019-04-01 ENCOUNTER — Encounter: Payer: Self-pay | Admitting: Family Medicine

## 2019-04-01 VITALS — BP 106/62 | HR 80 | Temp 98.6°F | Ht 63.0 in | Wt 126.4 lb

## 2019-04-01 DIAGNOSIS — R300 Dysuria: Secondary | ICD-10-CM

## 2019-04-01 DIAGNOSIS — N3000 Acute cystitis without hematuria: Secondary | ICD-10-CM | POA: Insufficient documentation

## 2019-04-01 LAB — POC URINALSYSI DIPSTICK (AUTOMATED)
Bilirubin, UA: NEGATIVE
Blood, UA: 50
Glucose, UA: NEGATIVE
Ketones, UA: NEGATIVE
Nitrite, UA: NEGATIVE
Protein, UA: NEGATIVE
Spec Grav, UA: 1.015 (ref 1.010–1.025)
Urobilinogen, UA: 0.2 E.U./dL
pH, UA: 6 (ref 5.0–8.0)

## 2019-04-01 MED ORDER — CEPHALEXIN 500 MG PO CAPS: 500.0000 mg | ORAL_CAPSULE | Freq: Two times a day (BID) | ORAL | 0 refills | Status: DC

## 2019-04-01 MED ORDER — FLUCONAZOLE 150 MG PO TABS: 150.0000 mg | ORAL_TABLET | Freq: Once | ORAL | 0 refills | Status: AC

## 2019-04-01 NOTE — Assessment & Plan Note (Signed)
In pt who also has sensitive bladder/ IC  tx with keflex  Culture sent Also diflucan px in case of yeast infection  Enc good fluid intake Handout given  Will update further when cx returns Update if not starting to improve in several days  or if worsening

## 2019-04-01 NOTE — Patient Instructions (Addendum)
Take cephalexin for the uti  With food  I sent diflucan in case you get a yeast infection (also eat yogurt)   We will send your urine for culture and let you know when it returns  Drink lots of water   Update     Urinary Tract Infection, Adult  A urinary tract infection (UTI) is an infection of any part of the urinary tract. The urinary tract includes the kidneys, ureters, bladder, and urethra. These organs make, store, and get rid of urine in the body. Your health care provider may use other names to describe the infection. An upper UTI affects the ureters and kidneys (pyelonephritis). A lower UTI affects the bladder (cystitis) and urethra (urethritis). What are the causes? Most urinary tract infections are caused by bacteria in your genital area, around the entrance to your urinary tract (urethra). These bacteria grow and cause inflammation of your urinary tract. What increases the risk? You are more likely to develop this condition if:  You have a urinary catheter that stays in place (indwelling).  You are not able to control when you urinate or have a bowel movement (you have incontinence).  You are female and you: ? Use a spermicide or diaphragm for birth control. ? Have low estrogen levels. ? Are pregnant.  You have certain genes that increase your risk (genetics).  You are sexually active.  You take antibiotic medicines.  You have a condition that causes your flow of urine to slow down, such as: ? An enlarged prostate, if you are female. ? Blockage in your urethra (stricture). ? A kidney stone. ? A nerve condition that affects your bladder control (neurogenic bladder). ? Not getting enough to drink, or not urinating often.  You have certain medical conditions, such as: ? Diabetes. ? A weak disease-fighting system (immunesystem). ? Sickle cell disease. ? Gout. ? Spinal cord injury. What are the signs or symptoms? Symptoms of this condition include:  Needing to  urinate right away (urgently).  Frequent urination or passing small amounts of urine frequently.  Pain or burning with urination.  Blood in the urine.  Urine that smells bad or unusual.  Trouble urinating.  Cloudy urine.  Vaginal discharge, if you are female.  Pain in the abdomen or the lower back. You may also have:  Vomiting or a decreased appetite.  Confusion.  Irritability or tiredness.  A fever.  Diarrhea. The first symptom in older adults may be confusion. In some cases, they may not have any symptoms until the infection has worsened. How is this diagnosed? This condition is diagnosed based on your medical history and a physical exam. You may also have other tests, including:  Urine tests.  Blood tests.  Tests for sexually transmitted infections (STIs). If you have had more than one UTI, a cystoscopy or imaging studies may be done to determine the cause of the infections. How is this treated? Treatment for this condition includes:  Antibiotic medicine.  Over-the-counter medicines to treat discomfort.  Drinking enough water to stay hydrated. If you have frequent infections or have other conditions such as a kidney stone, you may need to see a health care provider who specializes in the urinary tract (urologist). In rare cases, urinary tract infections can cause sepsis. Sepsis is a life-threatening condition that occurs when the body responds to an infection. Sepsis is treated in the hospital with IV antibiotics, fluids, and other medicines. Follow these instructions at home:  Medicines  Take over-the-counter and prescription medicines  only as told by your health care provider.  If you were prescribed an antibiotic medicine, take it as told by your health care provider. Do not stop using the antibiotic even if you start to feel better. General instructions  Make sure you: ? Empty your bladder often and completely. Do not hold urine for long periods of  time. ? Empty your bladder after sex. ? Wipe from front to back after a bowel movement if you are female. Use each tissue one time when you wipe.  Drink enough fluid to keep your urine pale yellow.  Keep all follow-up visits as told by your health care provider. This is important. Contact a health care provider if:  Your symptoms do not get better after 1-2 days.  Your symptoms go away and then return. Get help right away if you have:  Severe pain in your back or your lower abdomen.  A fever.  Nausea or vomiting. Summary  A urinary tract infection (UTI) is an infection of any part of the urinary tract, which includes the kidneys, ureters, bladder, and urethra.  Most urinary tract infections are caused by bacteria in your genital area, around the entrance to your urinary tract (urethra).  Treatment for this condition often includes antibiotic medicines.  If you were prescribed an antibiotic medicine, take it as told by your health care provider. Do not stop using the antibiotic even if you start to feel better.  Keep all follow-up visits as told by your health care provider. This is important. This information is not intended to replace advice given to you by your health care provider. Make sure you discuss any questions you have with your health care provider. Document Released: 05/04/2005 Document Revised: 07/12/2018 Document Reviewed: 02/01/2018 Elsevier Patient Education  2020 [employer].
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
## 2019-04-02 ENCOUNTER — Other Ambulatory Visit: Payer: BC Managed Care – PPO

## 2019-04-03 ENCOUNTER — Encounter: Payer: Self-pay | Admitting: Family Medicine

## 2019-04-03 LAB — URINE CULTURE
MICRO NUMBER:: 803597
SPECIMEN QUALITY:: ADEQUATE

## 2019-04-17 ENCOUNTER — Ambulatory Visit
Admission: RE | Admit: 2019-04-17 | Discharge: 2019-04-17 | Disposition: A | Discharge status: Home / Self Care | Payer: BC Managed Care – PPO | Source: Ambulatory Visit | Attending: Family Medicine | Admitting: Family Medicine

## 2019-04-17 ENCOUNTER — Other Ambulatory Visit: Payer: Self-pay

## 2019-04-17 DIAGNOSIS — E2839 Other primary ovarian failure: Secondary | ICD-10-CM | POA: Insufficient documentation

## 2019-04-23 ENCOUNTER — Encounter: Payer: Self-pay | Admitting: *Deleted

## 2019-04-24 ENCOUNTER — Ambulatory Visit: Payer: BC Managed Care – PPO | Admitting: Family Medicine

## 2019-05-27 ENCOUNTER — Other Ambulatory Visit: Payer: BC Managed Care – PPO

## 2019-07-18 ENCOUNTER — Telehealth: Payer: Self-pay | Admitting: Family Medicine

## 2019-07-18 NOTE — Telephone Encounter (Signed)
She has  [employer] so not sure-would expect coverage but there are different versions.  If unable to do a virtual visit I recommend urgent care for eval

## 2019-07-18 NOTE — Telephone Encounter (Signed)
I will see her then  

## 2019-07-18 NOTE — Telephone Encounter (Signed)
Pt called stating she is having sinus issues her whole face hurts.  Pt stated she has cough with it now.  She stated she was alittle achy (body) no fever  Sinus started last week.  Cough started Monday   Offered virtual pt declined didn't know if insurance would pay or not.  Her daughter insurance didn't pay for virtual appointment.  Pt just wanted advise.

## 2019-07-18 NOTE — Telephone Encounter (Signed)
Pt advised of Dr. Marliss Coots comments. She said she does want to proceed with virtual appt because she wants to avoid UC. I advise pt she can check with ins to see if they cover visit before appt. Appt scheduled tomorrow with Dr. Glori Bickers (FYI TO PCP)

## 2019-07-19 ENCOUNTER — Other Ambulatory Visit: Payer: Self-pay

## 2019-07-19 ENCOUNTER — Encounter: Payer: Self-pay | Admitting: Family Medicine

## 2019-07-19 ENCOUNTER — Ambulatory Visit (INDEPENDENT_AMBULATORY_CARE_PROVIDER_SITE_OTHER): Payer: BC Managed Care – PPO | Admitting: Family Medicine

## 2019-07-19 DIAGNOSIS — Z20822 Contact with and (suspected) exposure to covid-19: Secondary | ICD-10-CM

## 2019-07-19 DIAGNOSIS — J0110 Acute frontal sinusitis, unspecified: Secondary | ICD-10-CM

## 2019-07-19 DIAGNOSIS — J019 Acute sinusitis, unspecified: Secondary | ICD-10-CM | POA: Insufficient documentation

## 2019-07-19 DIAGNOSIS — J0100 Acute maxillary sinusitis, unspecified: Secondary | ICD-10-CM | POA: Insufficient documentation

## 2019-07-19 MED ORDER — AZITHROMYCIN 250 MG PO TABS: ORAL_TABLET | ORAL | 0 refills | Status: DC
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
## 2019-07-19 NOTE — Patient Instructions (Signed)
Take the zpack as directed for sinusitis  Nasal saline and mucinex DM may also help  Drink fluids and rest Go get tested for covid at [facility]  Isolate until you get a result

## 2019-07-21 LAB — NOVEL CORONAVIRUS, NAA: SARS-CoV-2, NAA: NOT DETECTED

## 2019-07-21 NOTE — Assessment & Plan Note (Signed)
With viral uri and aches but no fever  Cover with zpak  Nasal saline  Fluids/rest Expectorant with DM otc prn cough  inst to go get tested for covid at [facility] (she is going now) Will isolate until negative result/symptoms are resolved  Update if not starting to improve in a week or if worsening

## 2019-10-29 ENCOUNTER — Encounter: Payer: Self-pay | Admitting: Internal Medicine

## 2019-10-29 ENCOUNTER — Ambulatory Visit (INDEPENDENT_AMBULATORY_CARE_PROVIDER_SITE_OTHER): Payer: BC Managed Care – PPO | Admitting: Internal Medicine

## 2019-10-29 DIAGNOSIS — B9689 Other specified bacterial agents as the cause of diseases classified elsewhere: Secondary | ICD-10-CM | POA: Diagnosis not present

## 2019-10-29 DIAGNOSIS — J019 Acute sinusitis, unspecified: Secondary | ICD-10-CM | POA: Diagnosis not present

## 2019-10-29 MED ORDER — DOXYCYCLINE HYCLATE 100 MG PO TABS: 100.0000 mg | ORAL_TABLET | Freq: Two times a day (BID) | ORAL | 0 refills | Status: DC

## 2019-10-29 NOTE — Patient Instructions (Signed)

## 2019-10-29 NOTE — Progress Notes (Signed)
Virtual Visit via Video Note  I connected with [place] on 10/29/19 at  3:00 PM EDT by a video enabled telemedicine application and verified that I am speaking with the correct person using two identifiers.  Location: Patient: Home Provider: Office   I discussed the limitations of evaluation and management by telemedicine and the availability of in person appointments. The patient expressed understanding and agreed to proceed.  HPI  Pt reports headache, facial pain, nasal congestion, ear pain and cough. This started 2 weeks ago. The headache is located on the right side of her forehead and left cheek. She is blowing blood tinged clear/brown mucous out of her nose. She describes the ear pain as fullness without ringing, drainage or loss of hearing. The cough is non productive. She denies fever, chills or body aches. She has tried Sudafed, Mucinex Sinus, Tylenol Cold/flu and nasal saline. She has not had sick contacts diagnosed with Covid 19. She does not smoke.   Review of Systems     Past Medical History:  Diagnosis Date  . Calculus in urethra   . Degeneration of lumbar or lumbosacral intervertebral disc   . Diffuse cystic mastopathy   . Dyspepsia and other specified disorders of function of stomach   . Enthesopathy of hip region   . Fibrocystic breast disease    multiple aspirations  . GERD (gastroesophageal reflux disease)    EGD negative 11/04  . Helicobacter pylori (H. pylori)    history   . IC (interstitial cystitis)   . Left ovarian cyst    x 2- pelvic ultrasound 11/2006  . Lump or mass in breast   . OA (osteoarthritis)   . Oral aphthae   . Seasonal allergies   . Solitary cyst of breast   . Urinary incontinence     Family History  Problem Relation Age of Onset  . Alcohol abuse Father        with liver problems  . Lung cancer Father   . Liver disease Father   . Cancer Father        lung  . Breast cancer Mother 40       recurrent  . Hyperlipidemia Mother    . Cancer Mother        breast, age 65  . Colon cancer Neg Hx     Social History   Socioeconomic History  . Marital status: Married    Spouse name: Not on file  . Number of children: 3  . Years of education: Not on file  . Highest education level: Not on file  Occupational History  . Occupation: [employer]  Tobacco Use  . Smoking status: Never Smoker  . Smokeless tobacco: Never Used  Substance and Sexual Activity  . Alcohol use: No    Alcohol/week: 0.0 standard drinks  . Drug use: No  . Sexual activity: Not on file  Other Topics Concern  . Not on file  Social History Narrative   1 caffeine drink/day      Regular exercise            Social Determinants of Health   Financial Resource Strain:   . Difficulty of Paying Living Expenses:   Food Insecurity:   . Worried About Charity fundraiser in the Last Year:   . Arboriculturist in the Last Year:   Transportation Needs:   . Film/video editor (Medical):   Marland Kitchen Lack of Transportation (Non-Medical):   Physical Activity:   .  Days of Exercise per Week:   . Minutes of Exercise per Session:   Stress:   . Feeling of Stress :   Social Connections:   . Frequency of Communication with Friends and Family:   . Frequency of Social Gatherings with Friends and Family:   . Attends Religious Services:   . Active Member of Clubs or Organizations:   . Attends Archivist Meetings:   Marland Kitchen Marital Status:   Intimate Partner Violence:   . Fear of Current or Ex-Partner:   . Emotionally Abused:   Marland Kitchen Physically Abused:   . Sexually Abused:     Allergies  Allergen Reactions  . Amoxicillin-Pot Clavulanate     REACTION: GI  . Esomeprazole Magnesium     abd pain   . Ibuprofen Other (See Comments)  . Levofloxacin     REACTION: itching  . Nsaids     REACTION: GI upset  . Pseudoephedrine-Guaifenesin Er     REACTION: insomnia  . Shrimp [Shellfish Allergy] Nausea And Vomiting  . Sulfonamide Derivatives     REACTION: hives      Constitutional: Positive headache. Denies fatigue, fever or abrupt weight changes.  HEENT:  Positive facial pain, ear pain, nasal congestion. Denies eye redness, ear pain, ringing in the ears, wax buildup, runny nose or sore throat. Respiratory: Positive cough. Denies difficulty breathing or shortness of breath.  Cardiovascular: Denies chest pain, chest tightness, palpitations or swelling in the hands or feet.   No other specific complaints in a complete review of systems (except as listed in HPI above).  Objective:    General: Appears her stated age, well developed, well nourished in NAD. HEENT: Head: normal shape and size;  Nose: congestion noted; Throat/Mouth: Hoarseness noted.  Pulmonary/Chest: Normal effort. No respiratory distress.      Assessment & Plan:   Acute Bacterial Sinusitis  Can use a Neti Pot which can be purchased from your local drug store. Flonase 2 sprays each nostril for 3 days and then as needed. Start Zyrtec at bedtime eRx forDoxycyline 100 mg  BID for 10 days  RTC as needed or if symptoms persist. [name], NP   Follow Up Instructions:    I discussed the assessment and treatment plan with the patient. The patient was provided an opportunity to ask questions and all were answered. The patient agreed with the plan and demonstrated an understanding of the instructions.   The patient was advised to call back or seek an in-person evaluation if the symptoms worsen or if the condition fails to improve as anticipated.    [name], NP

## 2019-12-11 ENCOUNTER — Telehealth (INDEPENDENT_AMBULATORY_CARE_PROVIDER_SITE_OTHER): Payer: BC Managed Care – PPO | Admitting: Family Medicine

## 2019-12-11 ENCOUNTER — Encounter: Payer: Self-pay | Admitting: Family Medicine

## 2019-12-11 DIAGNOSIS — G2581 Restless legs syndrome: Secondary | ICD-10-CM | POA: Diagnosis not present

## 2019-12-11 DIAGNOSIS — E538 Deficiency of other specified B group vitamins: Secondary | ICD-10-CM

## 2019-12-11 DIAGNOSIS — E559 Vitamin D deficiency, unspecified: Secondary | ICD-10-CM

## 2019-12-11 MED ORDER — ROPINIROLE HCL 0.5 MG PO TABS: 0.2500 mg | ORAL_TABLET | Freq: Every day | ORAL | 0 refills | Status: DC

## 2019-12-11 NOTE — Assessment & Plan Note (Signed)
Not compliant with oral dosing Urged her to start back  Planned lab for level and also b12 shot

## 2019-12-11 NOTE — Assessment & Plan Note (Signed)
Acute on chronic  Pt is not compliant with B12 and vit D dosing  No h/o iron def  Good sleep hygeine Disc trial of requip (and side eff pot)  Trial of 0.025 mg for 3 d and adv to 0.5 mg nightly if well controlled Planned B12 and vit D level check  Also B12 shot  Strongly enc to take her supplements as ordered Update if not starting to improve in a week or if worsening

## 2019-12-11 NOTE — Assessment & Plan Note (Signed)
Not compliant with dosing Level planned Enc her to take as directed

## 2019-12-11 NOTE — Patient Instructions (Signed)
Take your B12 and vitamin D as ordered   Try requip 1/2 pill each night- after 3 days you can go up to a whole pill Let us know if any significant side effects   Stay hydrated   The office will call you to set up a lab appt. And B12 shot as well   Let us know how the medication works in the next 1-2 weeks please

## 2019-12-11 NOTE — Progress Notes (Signed)
Virtual Visit via Video Note  I connected with [place] on 12/11/19 at 12:00 PM EDT by a video enabled telemedicine application and verified that I am speaking with the correct person using two identifiers.  Location: Patient: home Provider: office    I discussed the limitations of evaluation and management by telemedicine and the availability of in person appointments. The patient expressed understanding and agreed to proceed.  Parties involved in encounter  Patient: Robin Henson  Provider:  Loura Pardon MD    History of Present Illness: Pt presents with c/o restless legs  Has always had issues with inability to sit still  This is becoming worse  At night -cannot resist need to move feet   Sunday tried otc ; unisom- made her sleepy but did not help legs  Also tried a muscle relaxer (it helped -but she felt groggy the next day) - generic for flexeril 10 mg   Going on vacation next week   Has varicose veins  Went to vein clinic yesterday-pending [place]   No h/o iron def  Lab Results  Component Value Date   WBC 4.1 02/15/2019   HGB 13.2 02/15/2019   HCT 39.2 02/15/2019   MCV 92 02/15/2019   PLT 190 02/15/2019   Daughters need iron def   Lab Results  Component Value Date   VITAMINB12 188 (L) 02/15/2019  takes 1000 mcg three days per week     Observations/Objective: Patient appears well, in no distress Weight is baseline  No facial swelling or asymmetry Normal voice-not hoarse and no slurred speech No obvious tremor or mobility impairment No tics noted in movement or speech Moving neck and UEs normally  Able to hear the call well  No cough or shortness of breath during interview  Talkative and mentally sharp with no cognitive changes No skin changes on face or neck , no rash or pallor Affect is normal    Assessment and Plan: Problem List Items Addressed This Visit      Other   Vitamin D deficiency    Not compliant with dosing Level planned Enc her to  take as directed       Vitamin B12 deficiency    Not compliant with oral dosing Urged her to start back  Planned lab for level and also b12 shot      RLS (restless legs syndrome) - Primary    Acute on chronic  Pt is not compliant with B12 and vit D dosing  No h/o iron def  Good sleep hygeine Disc trial of requip (and side eff pot)  Trial of 0.025 mg for 3 d and adv to 0.5 mg nightly if well controlled Planned B12 and vit D level check  Also B12 shot  Strongly enc to take her supplements as ordered Update if not starting to improve in a week or if worsening            Follow Up Instructions: Take your B12 and vitamin D as ordered   Try requip 1/2 pill each night- after 3 days you can go up to a whole pill Let us know if any significant side effects   Stay hydrated   The office will call you to set up a lab appt. And B12 shot as well   Let us know how the medication works in the next 1-2 weeks please    I discussed the assessment and treatment plan with the patient. The patient was provided an opportunity to ask  questions and all were answered. The patient agreed with the plan and demonstrated an understanding of the instructions.   The patient was advised to call back or seek an in-person evaluation if the symptoms worsen or if the condition fails to improve as anticipated.     Loura Pardon, MD

## 2019-12-12 ENCOUNTER — Telehealth: Payer: Self-pay

## 2019-12-12 ENCOUNTER — Ambulatory Visit (INDEPENDENT_AMBULATORY_CARE_PROVIDER_SITE_OTHER): Payer: BC Managed Care – PPO

## 2019-12-12 ENCOUNTER — Ambulatory Visit: Payer: BC Managed Care – PPO

## 2019-12-12 ENCOUNTER — Other Ambulatory Visit: Payer: Self-pay

## 2019-12-12 ENCOUNTER — Other Ambulatory Visit (INDEPENDENT_AMBULATORY_CARE_PROVIDER_SITE_OTHER): Payer: BC Managed Care – PPO

## 2019-12-12 DIAGNOSIS — E538 Deficiency of other specified B group vitamins: Secondary | ICD-10-CM | POA: Diagnosis not present

## 2019-12-12 DIAGNOSIS — G2581 Restless legs syndrome: Secondary | ICD-10-CM

## 2019-12-12 DIAGNOSIS — E559 Vitamin D deficiency, unspecified: Secondary | ICD-10-CM

## 2019-12-12 MED ORDER — CYANOCOBALAMIN 1000 MCG/ML IJ SOLN
1000.0000 ug | Freq: Once | INTRAMUSCULAR | Status: AC
  Administered 2019-12-12: 1000 ug via INTRAMUSCULAR

## 2019-12-12 NOTE — Telephone Encounter (Signed)
Pt had virtual visit on 12/11/19; pt was given requip 0.5 mg taking 1/2 tab po at hs. After taking the requip last night pt developed heart burning and belching this morning. Pt said she has a sensitive stomach and wants to know what pt should do. Pt ate prior to taking the requip. Pt said she did have CP but after belching it felt better; pt said she does have indigestion now;No N&V. pts legs did not bother her last night. [facility] [place]. Pt request cb. UC & ED precautions given and pt voiced understanding.

## 2019-12-12 NOTE — Telephone Encounter (Signed)
It is a possible side effect but could also be a fluke.  Would she feel comfortable trying it one more time? (eat something with the pill itself)    if it happens again then I think we can determine it is a side effect and re evaluate

## 2019-12-12 NOTE — Progress Notes (Signed)
Pt given B12 in Left Deltoid. Pt tolerated well. Labs were drawn prior to injections.

## 2019-12-12 NOTE — Telephone Encounter (Signed)
Pt said she will try it again and update [place] tomorrow but she wanted to make sure Dr. Glori Bickers knew she did eat some crackers when she took the pill yesterday too. Pt will update [place] tomorrow

## 2019-12-12 NOTE — Addendum Note (Signed)
Addended by: Ellamae Sia on: 12/12/2019 08:17 AM   Modules accepted: Orders

## 2019-12-13 LAB — CBC WITH DIFFERENTIAL/PLATELET
Basophils Absolute: 0.1 10*3/uL (ref 0.0–0.2)
Basos: 1 %
EOS (ABSOLUTE): 0.1 10*3/uL (ref 0.0–0.4)
Eos: 2 %
Hematocrit: 40.7 % (ref 34.0–46.6)
Hemoglobin: 13.6 g/dL (ref 11.1–15.9)
Immature Grans (Abs): 0 10*3/uL (ref 0.0–0.1)
Immature Granulocytes: 0 %
Lymphocytes Absolute: 1.9 10*3/uL (ref 0.7–3.1)
Lymphs: 41 %
MCH: 31.1 pg (ref 26.6–33.0)
MCHC: 33.4 g/dL (ref 31.5–35.7)
MCV: 93 fL (ref 79–97)
Monocytes Absolute: 0.4 10*3/uL (ref 0.1–0.9)
Monocytes: 9 %
Neutrophils Absolute: 2.2 10*3/uL (ref 1.4–7.0)
Neutrophils: 47 %
Platelets: 204 10*3/uL (ref 150–450)
RBC: 4.37 x10E6/uL (ref 3.77–5.28)
RDW: 12.3 % (ref 11.7–15.4)
WBC: 4.6 10*3/uL (ref 3.4–10.8)

## 2019-12-13 LAB — VITAMIN D 25 HYDROXY (VIT D DEFICIENCY, FRACTURES): Vit D, 25-Hydroxy: 29.1 ng/mL — ABNORMAL LOW (ref 30.0–100.0)

## 2019-12-13 LAB — VITAMIN B12: Vitamin B-12: 481 pg/mL (ref 232–1245)

## 2019-12-13 MED ORDER — CYCLOBENZAPRINE HCL 5 MG PO TABS: 5.0000 mg | ORAL_TABLET | Freq: Every day | ORAL | 1 refills | Status: DC

## 2019-12-13 NOTE — Telephone Encounter (Signed)
Stop the requip/ take off med list and add to allergy list  Try the 5 mg of flexeril at bedtime for the restless legs and let me know how that goes before we do anything else

## 2019-12-13 NOTE — Telephone Encounter (Signed)
Patient returning call She would like to be called on her home # [phone]

## 2019-12-13 NOTE — Telephone Encounter (Signed)
Pt notified of Dr. Marliss Coots comments and Rx sent to pharmacy. Pt will keep [place] posted

## 2019-12-13 NOTE — Telephone Encounter (Signed)
Left 2nd VM requesting pt to call the office back  

## 2019-12-13 NOTE — Telephone Encounter (Signed)
Left VM requesting pt to call the office back, med removed from med list and added to allergy list.   Dr. Glori Bickers does pt already have flexeril because it's not on med list so I didn't know if an Rx needs to be sent or does pt have med already an I just need to add it to med list

## 2019-12-13 NOTE — Telephone Encounter (Signed)
I pended a px for flexeril 5 mg to try qhs if she needs px Thanks

## 2019-12-13 NOTE — Telephone Encounter (Signed)
Patient called with update on medication this morning. Patient states she still has indigestion this morning. Her joints were hurting last night to the point she could only lay on her back. Medication does help her RLS symptoms but she is concerned about these other symptoms with this. Patient states Dr  Glori Bickers mentioned maybe trying muscle relaxer, she has Flexeril 10 mg and wonders if she can just take 1/2 tablet of that medication (10 mg makes her too drowsy). At least until she is back from vacation-she is leaving this Sunday for vacation and does not come back till next Sunday. Please advise.

## 2019-12-16 ENCOUNTER — Telehealth: Payer: Self-pay | Admitting: *Deleted

## 2019-12-16 NOTE — Telephone Encounter (Signed)
Left VM requesting pt to call the office back regarding lab results. (she did state she would be out of town and hard to reach this week), i'll await her call back

## 2019-12-18 MED ORDER — PRAMIPEXOLE DIHYDROCHLORIDE 0.125 MG PO TABS: 0.1250 mg | ORAL_TABLET | Freq: Every day | ORAL | 1 refills | Status: DC

## 2019-12-18 NOTE — Telephone Encounter (Signed)
I sent in the mirapex Start with one pill a few hours before bedtime daily  If it works we can titrate dose up so let me know after 3-4 days how it goes   Start miralax 1-3 times daily over the counter   For more urgent treatment  Milk of magnesia is good for constipation -as directed If not effective can try magnesium citrate (comes in a bottle over the counter A laxative like correctol can help also   Make sure to drink plenty of water  Keep me posted

## 2019-12-18 NOTE — Telephone Encounter (Signed)
Pt notified of Dr. Tower's comments and verbalized understanding  

## 2019-12-18 NOTE — Telephone Encounter (Signed)
-----   Message from Robin Henson, [place] sent at 12/18/2019  4:41 PM EDT ----- Pt notified of Dr. Marliss Coots comments she still wants to try the [name] said she will try it when she gets back in town. Pt asked why Dr. Glori Bickers didn't comment regarding her constipation. I asked pt has she tried miralax or any OTC meds for it. Pt said she hasn't but she is willing to try something if Dr. Glori Bickers recommends it. She didn't want to take miralax since it can take a few days to work and she's on day 4 almost 5 of no BM. Pt wants to know what she can take OTC to have a BM asap

## 2020-03-04 ENCOUNTER — Other Ambulatory Visit: Payer: Self-pay | Admitting: Family Medicine

## 2020-03-18 ENCOUNTER — Telehealth: Payer: Self-pay | Admitting: Family Medicine

## 2020-03-18 DIAGNOSIS — M810 Age-related osteoporosis without current pathological fracture: Secondary | ICD-10-CM

## 2020-03-18 DIAGNOSIS — E538 Deficiency of other specified B group vitamins: Secondary | ICD-10-CM

## 2020-03-18 DIAGNOSIS — E559 Vitamin D deficiency, unspecified: Secondary | ICD-10-CM

## 2020-03-18 DIAGNOSIS — Z0000 Encounter for general adult medical examination without abnormal findings: Secondary | ICD-10-CM

## 2020-03-18 NOTE — Telephone Encounter (Signed)
-----   Message from Cloyd Stagers, RT sent at 03/05/2020 10:03 AM EDT ----- Regarding: Lab Orders for Thursday 8.12.2021 Please place lab orders for Thursday 8.12.2021, office visit for physical on Friday 8.20.2021 Thank you, Dyke Maes RT(R)

## 2020-03-19 ENCOUNTER — Other Ambulatory Visit (INDEPENDENT_AMBULATORY_CARE_PROVIDER_SITE_OTHER): Payer: BC Managed Care – PPO

## 2020-03-19 ENCOUNTER — Other Ambulatory Visit: Payer: Self-pay

## 2020-03-19 ENCOUNTER — Other Ambulatory Visit: Payer: BC Managed Care – PPO

## 2020-03-19 DIAGNOSIS — E559 Vitamin D deficiency, unspecified: Secondary | ICD-10-CM

## 2020-03-19 DIAGNOSIS — Z0000 Encounter for general adult medical examination without abnormal findings: Secondary | ICD-10-CM | POA: Diagnosis not present

## 2020-03-19 DIAGNOSIS — E538 Deficiency of other specified B group vitamins: Secondary | ICD-10-CM

## 2020-03-19 NOTE — Addendum Note (Signed)
Addended by: Ellamae Sia on: 03/19/2020 09:05 AM   Modules accepted: Orders

## 2020-03-20 LAB — LIPID PANEL
Chol/HDL Ratio: 2.5 ratio (ref 0.0–4.4)
Cholesterol, Total: 172 mg/dL (ref 100–199)
HDL: 68 mg/dL (ref >39)
LDL Chol Calc (NIH): 94 mg/dL (ref 0–99)
Triglycerides: 48 mg/dL (ref 0–149)
VLDL Cholesterol Cal: 10 mg/dL (ref 5–40)

## 2020-03-20 LAB — CBC WITH DIFFERENTIAL/PLATELET
Basophils Absolute: 0 10*3/uL (ref 0.0–0.2)
Basos: 1 %
EOS (ABSOLUTE): 0 10*3/uL (ref 0.0–0.4)
Eos: 1 %
Hematocrit: 38.3 % (ref 34.0–46.6)
Hemoglobin: 13 g/dL (ref 11.1–15.9)
Immature Grans (Abs): 0 10*3/uL (ref 0.0–0.1)
Immature Granulocytes: 0 %
Lymphocytes Absolute: 1.5 10*3/uL (ref 0.7–3.1)
Lymphs: 34 %
MCH: 31.5 pg (ref 26.6–33.0)
MCHC: 33.9 g/dL (ref 31.5–35.7)
MCV: 93 fL (ref 79–97)
Monocytes Absolute: 0.4 10*3/uL (ref 0.1–0.9)
Monocytes: 8 %
Neutrophils Absolute: 2.5 10*3/uL (ref 1.4–7.0)
Neutrophils: 56 %
Platelets: 170 10*3/uL (ref 150–450)
RBC: 4.13 x10E6/uL (ref 3.77–5.28)
RDW: 12.9 % (ref 11.7–15.4)
WBC: 4.4 10*3/uL (ref 3.4–10.8)

## 2020-03-20 LAB — COMPREHENSIVE METABOLIC PANEL
ALT: 17 IU/L (ref 0–32)
AST: 18 IU/L (ref 0–40)
Albumin/Globulin Ratio: 1.8 (ref 1.2–2.2)
Albumin: 4.2 g/dL (ref 3.8–4.9)
Alkaline Phosphatase: 56 IU/L (ref 48–121)
BUN/Creatinine Ratio: 18 (ref 9–23)
BUN: 11 mg/dL (ref 6–24)
Bilirubin Total: 0.5 mg/dL (ref 0.0–1.2)
CO2: 25 mmol/L (ref 20–29)
Calcium: 9.3 mg/dL (ref 8.7–10.2)
Chloride: 105 mmol/L (ref 96–106)
Creatinine, Ser: 0.6 mg/dL (ref 0.57–1.00)
GFR calc Af Amer: 118 mL/min/{1.73_m2} (ref >59)
GFR calc non Af Amer: 102 mL/min/{1.73_m2} (ref >59)
Globulin, Total: 2.4 g/dL (ref 1.5–4.5)
Glucose: 84 mg/dL (ref 65–99)
Potassium: 4.1 mmol/L (ref 3.5–5.2)
Sodium: 142 mmol/L (ref 134–144)
Total Protein: 6.6 g/dL (ref 6.0–8.5)

## 2020-03-20 LAB — VITAMIN D 25 HYDROXY (VIT D DEFICIENCY, FRACTURES): Vit D, 25-Hydroxy: 24.3 ng/mL — ABNORMAL LOW (ref 30.0–100.0)

## 2020-03-20 LAB — VITAMIN B12: Vitamin B-12: 207 pg/mL — ABNORMAL LOW (ref 232–1245)

## 2020-03-20 LAB — TSH: TSH: 1.92 u[IU]/mL (ref 0.450–4.500)

## 2020-03-27 ENCOUNTER — Encounter: Payer: BC Managed Care – PPO | Admitting: Family Medicine

## 2020-04-15 ENCOUNTER — Encounter: Payer: Self-pay | Admitting: Family Medicine

## 2020-04-15 ENCOUNTER — Ambulatory Visit (INDEPENDENT_AMBULATORY_CARE_PROVIDER_SITE_OTHER): Payer: BC Managed Care – PPO | Admitting: Family Medicine

## 2020-04-15 ENCOUNTER — Other Ambulatory Visit: Payer: Self-pay

## 2020-04-15 VITALS — BP 112/78 | HR 66 | Temp 97.4°F | Ht 62.75 in | Wt 127.0 lb

## 2020-04-15 DIAGNOSIS — E538 Deficiency of other specified B group vitamins: Secondary | ICD-10-CM | POA: Diagnosis not present

## 2020-04-15 DIAGNOSIS — E559 Vitamin D deficiency, unspecified: Secondary | ICD-10-CM | POA: Diagnosis not present

## 2020-04-15 DIAGNOSIS — Z0000 Encounter for general adult medical examination without abnormal findings: Secondary | ICD-10-CM | POA: Diagnosis not present

## 2020-04-15 DIAGNOSIS — M810 Age-related osteoporosis without current pathological fracture: Secondary | ICD-10-CM

## 2020-04-15 DIAGNOSIS — Z85820 Personal history of malignant melanoma of skin: Secondary | ICD-10-CM

## 2020-04-15 MED ORDER — FLUTICASONE PROPIONATE 50 MCG/ACT NA SUSP: 2.0000 | Freq: Every day | NASAL | 3 refills | Status: DC | PRN

## 2020-04-15 MED ORDER — CYANOCOBALAMIN 1000 MCG/ML IJ SOLN
1000.0000 ug | Freq: Once | INTRAMUSCULAR | Status: AC
  Administered 2020-04-15: 1000 ug via INTRAMUSCULAR

## 2020-04-15 MED ORDER — ERGOCALCIFEROL 1.25 MG (50000 UT) PO CAPS: 50000.0000 [IU] | ORAL_CAPSULE | ORAL | 0 refills | Status: DC

## 2020-04-15 NOTE — Assessment & Plan Note (Signed)
Sees dermatology yearly  Nothing new

## 2020-04-15 NOTE — Patient Instructions (Addendum)
effexor xr and gabapentin are sometimes used for hot flashes with menopause  If interested please let us know   If you are interested in the shingles vaccine series (Shingrix), call your insurance or pharmacy to check on coverage and location it must be given.  If affordable - you can schedule it here or at your pharmacy depending on coverage   Take px vitamin D once weekly for 12 weeks Also take your 5000 iu daily - ongoing Keep walking    Lets start B12 shots every 6 weeks (starting today)  Also take your oral B12 daily   Eat healthy and keep exercising

## 2020-04-15 NOTE — Assessment & Plan Note (Signed)
Low B12 level  Enc her to start taking oral B12 (not compliant)  Also B12 shot given today

## 2020-04-15 NOTE — Assessment & Plan Note (Signed)
dexa 9/20 rev  Declines any treatment unfortunately  No falls or fx  Noncompliant with vit D and level is low at 24 Will start Px vit D Encouraged resistance exercise and walking
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
## 2020-04-15 NOTE — Assessment & Plan Note (Signed)
Level low at 24 Not compliant with supplementation  Asked her to re consider taking it 5000 iu daily  Also px ergocalciferol high dose weekly tx for 12 weeks Disc imp for bone and overall health  She has OP

## 2020-04-15 NOTE — Assessment & Plan Note (Signed)
Reviewed health habits including diet and exercise and skin cancer prevention Reviewed appropriate screening tests for age  Also reviewed health mt list, fam hx and immunization status , as well as social and family history   See HPI Labs reviewed  Suffering with menopausal symptoms -offered info on gabapentin or effexor  Declines tx for OP  Not compliant with vit B12 or vit D treatment  Declines treatment for mood/stress currently  covid immunized  [facility] Is interested in shingrix vaccine if covered

## 2020-05-15 ENCOUNTER — Telehealth: Payer: Self-pay

## 2020-05-15 NOTE — Telephone Encounter (Signed)
Checking NV on the schedule it was noticed that a NV was scheduled for Prolia that pt has not had benefits checked for prolia or labs done or any msg from provider that prolia was ordered.  Contacted pt and inquired if she requested prolia and she denied requesting prolia and reports she is to get B12  injections Q6 wks and that is what she thinks the apt on 10/28 is for. Advised pt the apt would be changed to inj of B12 and that is what she would receive on 10/28. Pt appreciative and verbalized understanding.

## 2020-06-04 ENCOUNTER — Ambulatory Visit (INDEPENDENT_AMBULATORY_CARE_PROVIDER_SITE_OTHER): Payer: BC Managed Care – PPO

## 2020-06-04 DIAGNOSIS — E538 Deficiency of other specified B group vitamins: Secondary | ICD-10-CM

## 2020-06-04 MED ORDER — CYANOCOBALAMIN 1000 MCG/ML IJ SOLN
1000.0000 ug | Freq: Once | INTRAMUSCULAR | Status: AC
  Administered 2020-06-04: 1000 ug via INTRAMUSCULAR

## 2020-06-04 NOTE — Progress Notes (Signed)
Per orders of [name] in the absence of Dr Jerilynn Mages. Tower, injection of B-12 given by Kem Parkinson, CMA in left deltoid. Patient tolerated injection well. Patient will make appointment for 6 week.

## 2020-07-01 ENCOUNTER — Other Ambulatory Visit: Payer: Self-pay | Admitting: Family Medicine

## 2020-08-26 ENCOUNTER — Other Ambulatory Visit: Payer: Self-pay

## 2020-08-26 ENCOUNTER — Telehealth (INDEPENDENT_AMBULATORY_CARE_PROVIDER_SITE_OTHER): Payer: BC Managed Care – PPO | Admitting: Family Medicine

## 2020-08-26 ENCOUNTER — Telehealth: Payer: BC Managed Care – PPO | Admitting: Family Medicine

## 2020-08-26 ENCOUNTER — Encounter: Payer: Self-pay | Admitting: Family Medicine

## 2020-08-26 DIAGNOSIS — U071 COVID-19: Secondary | ICD-10-CM | POA: Diagnosis not present

## 2020-08-26 DIAGNOSIS — K121 Other forms of stomatitis: Secondary | ICD-10-CM

## 2020-08-26 MED ORDER — FLUCONAZOLE 150 MG PO TABS: 150.0000 mg | ORAL_TABLET | Freq: Once | ORAL | 0 refills | Status: AC

## 2020-08-26 MED ORDER — MAGIC MOUTHWASH W/LIDOCAINE: 5.0000 mL | Freq: Three times a day (TID) | ORAL | 0 refills | Status: DC | PRN

## 2020-08-26 MED ORDER — FLUCONAZOLE 150 MG PO TABS: 150.0000 mg | ORAL_TABLET | Freq: Once | ORAL | 0 refills | Status: DC

## 2020-08-26 MED ORDER — AZITHROMYCIN 250 MG PO TABS: ORAL_TABLET | ORAL | 0 refills | Status: DC
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
## 2020-08-26 NOTE — Assessment & Plan Note (Signed)
With respiratory symptoms and sinusitis  tx with zpack  Diflucan if needed for yeast infection  Disc symptom control  Recommend nasal saline spray and continue flonase Update if not starting to improve in a week or if worsening

## 2020-08-26 NOTE — Patient Instructions (Signed)
Continue fluids and rest  Isolate until symptoms are better  Nasal saline spray may help congestion  Take zpak as directed (diflucan for yeast infection if you get one)  Magic mouthwash for mouth ulcer  If short of breath or severe symptoms-go to the ER Update if not starting to improve in a week or if worsening

## 2020-08-26 NOTE — Assessment & Plan Note (Signed)
Under tongue with covid 19 virus  She gets these with uri  Px magic mouthwash  Update if not starting to improve in a week or if worsening   Avoid acidic Arletha Grippe foods

## 2020-09-10 ENCOUNTER — Other Ambulatory Visit: Payer: Self-pay

## 2020-09-10 ENCOUNTER — Ambulatory Visit: Payer: BC Managed Care – PPO | Admitting: Family Medicine

## 2020-09-10 ENCOUNTER — Encounter: Payer: Self-pay | Admitting: Family Medicine

## 2020-09-10 VITALS — BP 100/70 | HR 78 | Temp 98.1°F | Ht 62.75 in | Wt 134.2 lb

## 2020-09-10 DIAGNOSIS — R1030 Lower abdominal pain, unspecified: Secondary | ICD-10-CM | POA: Diagnosis not present

## 2020-09-10 DIAGNOSIS — E538 Deficiency of other specified B group vitamins: Secondary | ICD-10-CM

## 2020-09-10 LAB — POC URINALSYSI DIPSTICK (AUTOMATED)
Bilirubin, UA: NEGATIVE
Blood, UA: NEGATIVE
Glucose, UA: NEGATIVE
Ketones, UA: NEGATIVE
Leukocytes, UA: NEGATIVE
Nitrite, UA: NEGATIVE
Protein, UA: NEGATIVE
Spec Grav, UA: 1.015 (ref 1.010–1.025)
Urobilinogen, UA: 0.2 E.U./dL
pH, UA: 6 (ref 5.0–8.0)

## 2020-09-10 MED ORDER — CYANOCOBALAMIN 1000 MCG/ML IJ SOLN
1000.0000 ug | Freq: Once | INTRAMUSCULAR | Status: AC
  Administered 2020-09-10: 1000 ug via INTRAMUSCULAR

## 2020-09-10 NOTE — Progress Notes (Signed)
Patient ID: Robin Henson, female    DOB: [birth year], 60 y.o.   MRN: [id number]  This visit was conducted in person.  BP 100/70   Pulse 78   Temp 98.1 F (36.7 C) (Temporal)   Ht 5' 2.75" (1.594 m)   Wt 134 lb 4 oz (60.9 kg)   SpO2 92%   BMI 23.97 kg/m    CC:  Chief Complaint  Patient presents with  . Back Pain    Lower Back  . Abdominal Pain    Subjective:   HPI: Robin Henson is a 60 y.o. female presenting on 09/10/2020 for Back Pain (Lower Back) and Abdominal Pain  She reports new onset in last 324 hours.. increased  Urination and low back   And bilateral lower quadrant abdominal pain. No dysuria. No hematuria.  Some urinary urgency.  no fever.   Normal BMs.Marland Kitchen this AM.   Mild nausea.. pushing fluids.  No V/D    Hx of UTI and urethral stricture. Has also been told she has bladder spasm in past.   Relevant past medical, surgical, family and social history reviewed and updated as indicated. Interim medical history since our last visit reviewed. Allergies and medications reviewed and updated. Outpatient Medications Prior to Visit  Medication Sig Dispense Refill  . Acetaminophen (TYLENOL ARTHRITIS PAIN PO) Take 2 tablets by mouth as needed.    . cholecalciferol (VITAMIN D3) 25 MCG (1000 UT) tablet Take 5,000 Units by mouth daily.    . Cyanocobalamin (VITAMIN B12 PO) Take 1,000 mcg by mouth daily.     . fluticasone (FLONASE) 50 MCG/ACT nasal spray Place 2 sprays into both nostrils daily as needed for allergies or rhinitis. 48 mL 3  . ibuprofen (ADVIL) 200 MG tablet Take 200 mg by mouth every 6 (six) hours as needed.    . Cyanocobalamin (VITAMIN B-12 IJ) Inject as directed. Every 6 weeks (Patient not taking: Reported on 09/10/2020)    . cyclobenzaprine (FLEXERIL) 5 MG tablet Take 1 tablet (5 mg total) by mouth at bedtime. For restless legs (Patient not taking: Reported on 09/10/2020) 30 tablet 1  . azithromycin (ZITHROMAX Z-PAK) 250 MG tablet Take 2 pills by mouth today  and then 1 pill daily for 4 days 6 tablet 0  . magic mouthwash w/lidocaine SOLN Take 5 mLs by mouth 3 (three) times daily as needed for mouth pain. 120 mL 0   No facility-administered medications prior to visit.     Per HPI unless specifically indicated in ROS section below Review of Systems  Constitutional: Negative for fatigue and fever.  HENT: Negative for congestion.   Eyes: Negative for pain.  Respiratory: Negative for cough and shortness of breath.   Cardiovascular: Negative for chest pain, palpitations and leg swelling.  Gastrointestinal: Positive for abdominal pain.  Genitourinary: Negative for dysuria and vaginal bleeding.  Musculoskeletal: Positive for back pain.  Neurological: Negative for syncope, light-headedness and headaches.  Psychiatric/Behavioral: Negative for dysphoric mood.   Objective:  BP 100/70   Pulse 78   Temp 98.1 F (36.7 C) (Temporal)   Ht 5' 2.75" (1.594 m)   Wt 134 lb 4 oz (60.9 kg)   SpO2 92%   BMI 23.97 kg/m   Wt Readings from Last 3 Encounters:  09/10/20 134 lb 4 oz (60.9 kg)  04/15/20 127 lb (57.6 kg)  07/19/19 122 lb (55.3 kg)      Physical Exam Constitutional:      General: She is not in acute  distress.    Appearance: Normal appearance. She is well-developed. She is not ill-appearing or toxic-appearing.  HENT:     Head: Normocephalic.     Right Ear: Hearing, tympanic membrane, ear canal and external ear normal. Tympanic membrane is not erythematous, retracted or bulging.     Left Ear: Hearing, tympanic membrane, ear canal and external ear normal. Tympanic membrane is not erythematous, retracted or bulging.     Nose: No mucosal edema or rhinorrhea.     Right Sinus: No maxillary sinus tenderness or frontal sinus tenderness.     Left Sinus: No maxillary sinus tenderness or frontal sinus tenderness.     Mouth/Throat:     Pharynx: Uvula midline.  Eyes:     General: Lids are normal. Lids are everted, no foreign bodies appreciated.      Conjunctiva/sclera: Conjunctivae normal.     Pupils: Pupils are equal, round, and reactive to light.  Neck:     Thyroid: No thyroid mass or thyromegaly.     Vascular: No carotid bruit.     Trachea: Trachea normal.  Cardiovascular:     Rate and Rhythm: Normal rate and regular rhythm.     Pulses: Normal pulses.     Heart sounds: Normal heart sounds, S1 normal and S2 normal. No murmur heard. No friction rub. No gallop.   Pulmonary:     Effort: Pulmonary effort is normal. No tachypnea or respiratory distress.     Breath sounds: Normal breath sounds. No decreased breath sounds, wheezing, rhonchi or rales.  Abdominal:     General: Bowel sounds are normal.     Palpations: Abdomen is soft.     Tenderness: There is no abdominal tenderness.  Musculoskeletal:     Cervical back: Normal range of motion and neck supple.  Skin:    General: Skin is warm and dry.     Findings: No rash.  Neurological:     Mental Status: She is alert.  Psychiatric:        Mood and Affect: Mood is not anxious or depressed.        Speech: Speech normal.        Behavior: Behavior normal. Behavior is cooperative.        Thought Content: Thought content normal.        Judgment: Judgment normal.       Results for orders placed or performed in visit on 03/19/20  CBC with Differential/Platelet  Result Value Ref Range   WBC 4.4 3.4 - 10.8 x10E3/uL   RBC 4.13 3.77 - 5.28 x10E6/uL   Hemoglobin 13.0 11.1 - 15.9 g/dL   Hematocrit 38.3 34.0 - 46.6 %   MCV 93 79 - 97 fL   MCH 31.5 26.6 - 33.0 pg   MCHC 33.9 31.5 - 35.7 g/dL   RDW 12.9 11.7 - 15.4 %   Platelets 170 150 - 450 x10E3/uL   Neutrophils 56 Not Estab. %   Lymphs 34 Not Estab. %   Monocytes 8 Not Estab. %   Eos 1 Not Estab. %   Basos 1 Not Estab. %   Neutrophils Absolute 2.5 1.4 - 7.0 x10E3/uL   Lymphocytes Absolute 1.5 0.7 - 3.1 x10E3/uL   Monocytes Absolute 0.4 0.1 - 0.9 x10E3/uL   EOS (ABSOLUTE) 0.0 0.0 - 0.4 x10E3/uL   Basophils Absolute 0.0 0.0 -  0.2 x10E3/uL   Immature Granulocytes 0 Not Estab. %   Immature Grans (Abs) 0.0 0.0 - 0.1 x10E3/uL  Comprehensive metabolic panel  Result  Value Ref Range   Glucose 84 65 - 99 mg/dL   BUN 11 6 - 24 mg/dL   Creatinine, Ser 0.60 0.57 - 1.00 mg/dL   GFR calc non Af Amer 102 >59 mL/min/1.73   GFR calc Af Amer 118 >59 mL/min/1.73   BUN/Creatinine Ratio 18 9 - 23   Sodium 142 134 - 144 mmol/L   Potassium 4.1 3.5 - 5.2 mmol/L   Chloride 105 96 - 106 mmol/L   CO2 25 20 - 29 mmol/L   Calcium 9.3 8.7 - 10.2 mg/dL   Total Protein 6.6 6.0 - 8.5 g/dL   Albumin 4.2 3.8 - 4.9 g/dL   Globulin, Total 2.4 1.5 - 4.5 g/dL   Albumin/Globulin Ratio 1.8 1.2 - 2.2   Bilirubin Total 0.5 0.0 - 1.2 mg/dL   Alkaline Phosphatase 56 48 - 121 IU/L   AST 18 0 - 40 IU/L   ALT 17 0 - 32 IU/L  Lipid panel  Result Value Ref Range   Cholesterol, Total 172 100 - 199 mg/dL   Triglycerides 48 0 - 149 mg/dL   HDL 68 >39 mg/dL   VLDL Cholesterol Cal 10 5 - 40 mg/dL   LDL Chol Calc (NIH) 94 0 - 99 mg/dL   Chol/HDL Ratio 2.5 0.0 - 4.4 ratio  TSH  Result Value Ref Range   TSH 1.920 0.450 - 4.500 uIU/mL  Vitamin B12  Result Value Ref Range   Vitamin B-12 207 (L) 232 - 1,245 pg/mL  VITAMIN D 25 Hydroxy (Vit-D Deficiency, Fractures)  Result Value Ref Range   Vit D, 25-Hydroxy 24.3 (L) 30.0 - 100.0 ng/mL    This visit occurred during the SARS-CoV-2 public health emergency.  Safety protocols were in place, including screening questions prior to the visit, additional usage of staff PPE, and extensive cleaning of exam room while observing appropriate contact time as indicated for disinfecting solutions.   COVID 19 screen:  No recent travel or known exposure to COVID19 The patient denies respiratory symptoms of COVID 19 at this time. The importance of social distancing was discussed today.   Assessment and Plan Lower abdominal pain: Neg UA, no sign of UT. Possible  Bladder spasms. Push fluids.  Avoid bladder irritant  including caffeine.  Can use the valium suppository given by urologist for likely bladder spasms. If not improving follow up with urology.  Call if pain increasing, fever or emesis.  Meds ordered this encounter  Medications  . cyanocobalamin ((VITAMIN B-12)) injection 1,000 mcg        Eliezer Lofts, MD

## 2020-09-10 NOTE — Patient Instructions (Signed)
Push fluids.  Avoid bladder irritant including caffeine.  Can use the valium suppository given by urologist for likely bladder spasms. If not improving follow up with urology.  Call if pain increasing, fever or emesis.

## 2020-09-11 ENCOUNTER — Ambulatory Visit: Payer: BC Managed Care – PPO | Admitting: Family Medicine

## 2020-10-27 ENCOUNTER — Ambulatory Visit: Payer: BC Managed Care – PPO

## 2020-11-03 ENCOUNTER — Telehealth: Payer: Self-pay | Admitting: Family Medicine

## 2020-11-03 ENCOUNTER — Other Ambulatory Visit (INDEPENDENT_AMBULATORY_CARE_PROVIDER_SITE_OTHER): Payer: BC Managed Care – PPO

## 2020-11-03 ENCOUNTER — Ambulatory Visit (INDEPENDENT_AMBULATORY_CARE_PROVIDER_SITE_OTHER): Payer: BC Managed Care – PPO

## 2020-11-03 ENCOUNTER — Other Ambulatory Visit: Payer: Self-pay

## 2020-11-03 DIAGNOSIS — E538 Deficiency of other specified B group vitamins: Secondary | ICD-10-CM | POA: Diagnosis not present

## 2020-11-03 MED ORDER — CYANOCOBALAMIN 1000 MCG/ML IJ SOLN
1000.0000 ug | Freq: Once | INTRAMUSCULAR | Status: AC
  Administered 2020-11-03: 1000 ug via INTRAMUSCULAR

## 2020-11-03 NOTE — Progress Notes (Signed)
Per orders of Dr. Loura Pardon, injection of b-12 given by Francella Solian in left deltoid. Patient tolerated injection well.

## 2020-11-03 NOTE — Telephone Encounter (Signed)
Coming in for B12 shot Needs level right before the shot please  Order done

## 2020-11-03 NOTE — Addendum Note (Signed)
Addended by: Cloyd Stagers on: 11/03/2020 03:23 PM   Modules accepted: Orders

## 2020-11-04 LAB — VITAMIN B12: Vitamin B-12: 408 pg/mL (ref 232–1245)

## 2020-12-16 ENCOUNTER — Ambulatory Visit (INDEPENDENT_AMBULATORY_CARE_PROVIDER_SITE_OTHER): Payer: BC Managed Care – PPO

## 2020-12-16 ENCOUNTER — Other Ambulatory Visit: Payer: Self-pay

## 2020-12-16 DIAGNOSIS — E538 Deficiency of other specified B group vitamins: Secondary | ICD-10-CM | POA: Diagnosis not present

## 2020-12-16 MED ORDER — CYANOCOBALAMIN 1000 MCG/ML IJ SOLN
1000.0000 ug | Freq: Once | INTRAMUSCULAR | Status: AC
Start: 2020-12-16 — End: 2020-12-16
  Administered 2020-12-16: 1000 ug via INTRAMUSCULAR

## 2020-12-16 NOTE — Progress Notes (Signed)
Per orders of Dr. Tower, injection of B12 given by Lynkin Saini G Kayleann Mccaffery. Patient tolerated injection well.  

## 2021-01-28 ENCOUNTER — Ambulatory Visit: Payer: BC Managed Care – PPO

## 2021-02-01 ENCOUNTER — Encounter: Payer: Self-pay | Admitting: Podiatry

## 2021-02-01 ENCOUNTER — Ambulatory Visit (INDEPENDENT_AMBULATORY_CARE_PROVIDER_SITE_OTHER): Payer: BC Managed Care – PPO

## 2021-02-01 ENCOUNTER — Ambulatory Visit (INDEPENDENT_AMBULATORY_CARE_PROVIDER_SITE_OTHER): Payer: BC Managed Care – PPO | Admitting: *Deleted

## 2021-02-01 ENCOUNTER — Ambulatory Visit: Payer: BC Managed Care – PPO | Admitting: Podiatry

## 2021-02-01 ENCOUNTER — Other Ambulatory Visit: Payer: Self-pay

## 2021-02-01 DIAGNOSIS — M722 Plantar fascial fibromatosis: Secondary | ICD-10-CM

## 2021-02-01 NOTE — Patient Instructions (Signed)

## 2021-02-01 NOTE — Progress Notes (Signed)
Patient presents today to be casted for custom molded orthotics. Dr. Paulla Dolly has been treating patient for plantar fasciitis.   Impression foam cast was taken. ABN signed.  Patient info-  Shoe size: 7-7.5  Shoe style: sneakers  Height: 5'2  Weight: 130  She would like the same style of orthotic she had previously. Pictures of the old orthotics were taken and will be sent to the lab for clarification.   Patient will be notified once orthotics arrive in office and reappoint for fitting at that time.

## 2021-02-01 NOTE — Progress Notes (Signed)
Subjective:   Patient ID: Margit Banda, female   DOB: 60 y.o.   MRN: [id number]   HPI Patient presents stating she started develop pain in her arch again has not had orthotics for fairly long time and feels like they are losing their effectiveness   ROS      Objective:  Physical Exam  Neurovascular status intact with right mid arch area being inflamed with pain upon palpation with moderate loss of function of the orthotic     Assessment:  Acute [place] fasciitis right that is not severely tender but bothersome with history of orthotics which have been effective     Plan:  H&P x-ray reviewed and I did go ahead today and casted for new orthotics which will hopefully give her a better positional component.  Reappoint when ready instructed on physical therapy stretching shoe gear modifications reappoint to recheck  X-rays were negative for signs of stress fracture did indicate spur formation

## 2021-02-04 ENCOUNTER — Ambulatory Visit (INDEPENDENT_AMBULATORY_CARE_PROVIDER_SITE_OTHER): Payer: BC Managed Care – PPO | Admitting: *Deleted

## 2021-02-04 ENCOUNTER — Other Ambulatory Visit: Payer: Self-pay

## 2021-02-04 DIAGNOSIS — E538 Deficiency of other specified B group vitamins: Secondary | ICD-10-CM

## 2021-02-04 MED ORDER — CYANOCOBALAMIN 1000 MCG/ML IJ SOLN
1000.0000 ug | Freq: Once | INTRAMUSCULAR | Status: AC
  Administered 2021-02-04: 1000 ug via INTRAMUSCULAR

## 2021-02-04 NOTE — Progress Notes (Signed)
Per orders of Dr. Glori Bickers injection of Vitamin B12 given in Left Deltoid by Lauralyn Primes.  Patient tolerated injection well.

## 2021-02-23 ENCOUNTER — Telehealth: Payer: Self-pay | Admitting: Podiatry

## 2021-02-23 NOTE — Telephone Encounter (Signed)
Orthotics in [place] office and pt is aware to pick up in [place] office. She is also aware of balance for the orthotics.

## 2021-04-15 ENCOUNTER — Other Ambulatory Visit: Payer: Self-pay | Admitting: Family Medicine

## 2021-04-15 ENCOUNTER — Telehealth (INDEPENDENT_AMBULATORY_CARE_PROVIDER_SITE_OTHER): Payer: BC Managed Care – PPO | Admitting: Nurse Practitioner

## 2021-04-15 ENCOUNTER — Encounter: Payer: Self-pay | Admitting: Nurse Practitioner

## 2021-04-15 VITALS — Temp 98.2°F | Wt 133.0 lb

## 2021-04-15 DIAGNOSIS — J40 Bronchitis, not specified as acute or chronic: Secondary | ICD-10-CM

## 2021-04-15 MED ORDER — AZITHROMYCIN 250 MG PO TABS: ORAL_TABLET | ORAL | 0 refills | Status: AC

## 2021-04-15 NOTE — Assessment & Plan Note (Signed)
Patient having constellation of symptoms of upper respiratory.  Given cough is purulent and patient's symptoms have been going on for over a week will elect to treat.  Patient does have many medication allergies did review with patient.  She has tolerated Z-Pak in the past.  Did look last QTC was 399.  Patient is on no QTC prolonging medications. Start azithromycin as soon as possible. Did discuss that patient may continue using over-the-counter medications for symptomatic relief if beneficial.

## 2021-04-15 NOTE — Progress Notes (Signed)
Patient ID: Robin Henson, female    DOB: Nov 12, 1960, 60 y.o.   MRN: [id number]  Virtual visit completed through [place], a video enabled telemedicine application. Due to national recommendations of social distancing due to COVID-19, a virtual visit is felt to be most appropriate for this patient at this time. Reviewed limitations, risks, security and privacy concerns of performing a virtual visit and the availability of in person appointments. I also reviewed that there may be a patient responsible charge related to this service. The patient agreed to proceed.   Patient location: home Provider location: [place] [facility], office Persons participating in this virtual visit: patient, provider   If any vitals were documented, they were collected by patient at home unless specified below.    Temp 98.2 F (36.8 C) Comment: per patient  Wt 133 lb (60.3 kg) Comment: per patient  BMI 23.75 kg/m    CC: Sinus pressure, cough Subjective:   HPI: Robin Henson is a 60 y.o. female presenting on 04/15/2021 for an acute video visit.  Last Tuesday  symptoms started . Patient states that she felt that her symptoms were improving then started worsening again. Patient has the at home covid test and has tested herself 7 times and they have all been negative. She has been using otc preparations of sinus medications with minimal relief. She has been vaccinated against covid with 2 injections and a booster. Not a smoker, never been a smoker.    Relevant past medical, surgical, family and social history reviewed and updated as indicated. Interim medical history since our last visit reviewed. Allergies and medications reviewed and updated. Outpatient Medications Prior to Visit  Medication Sig Dispense Refill   Acetaminophen (TYLENOL ARTHRITIS PAIN PO) Take 2 tablets by mouth as needed.     cholecalciferol (VITAMIN D3) 25 MCG (1000 UT) tablet Take 5,000 Units by mouth daily.     Cyanocobalamin  (VITAMIN B-12 IJ) Inject as directed. Every 6 weeks     Cyanocobalamin (VITAMIN B12 PO) Take 1,000 mcg by mouth daily.      cyclobenzaprine (FLEXERIL) 5 MG tablet Take 1 tablet (5 mg total) by mouth at bedtime. For restless legs 30 tablet 1   fluticasone (FLONASE) 50 MCG/ACT nasal spray Place 2 sprays into both nostrils daily as needed for allergies or rhinitis. 48 mL 3   ibuprofen (ADVIL) 200 MG tablet Take 200 mg by mouth every 6 (six) hours as needed.     No facility-administered medications prior to visit.     Per HPI unless specifically indicated in ROS section below Review of Systems  Constitutional:  Positive for fatigue. Negative for chills and fever.  HENT:  Positive for ear pain, sinus pressure, sinus pain and sore throat.   Respiratory:  Positive for cough (greenish with blood). Negative for shortness of breath.   Cardiovascular:  Negative for chest pain.  Gastrointestinal:  Negative for abdominal pain, diarrhea, nausea and vomiting.  Musculoskeletal:  Negative for myalgias.  Neurological:  Positive for headaches.  Objective:  Temp 98.2 F (36.8 C) Comment: per patient  Wt 133 lb (60.3 kg) Comment: per patient  BMI 23.75 kg/m   Wt Readings from Last 3 Encounters:  04/15/21 133 lb (60.3 kg)  09/10/20 134 lb 4 oz (60.9 kg)  04/15/20 127 lb (57.6 kg)       Physical exam: Gen: alert, NAD, not ill appearing Pulm: speaks in complete sentences without increased work of breathing Psych: normal mood, normal thought  content      Results for orders placed or performed in visit on 11/03/20  Vitamin B12  Result Value Ref Range   Vitamin B-12 408 232 - 1,245 pg/mL   Assessment & Plan:   Problem List Items Addressed This Visit       Respiratory   Bronchitis - Primary    Patient having constellation of symptoms of upper respiratory.  Given cough is purulent and patient's symptoms have been going on for over a week will elect to treat.  Patient does have many medication  allergies did review with patient.  She has tolerated Z-Pak in the past.  Did look last QTC was 399.  Patient is on no QTC prolonging medications. Start azithromycin as soon as possible. Did discuss that patient may continue using over-the-counter medications for symptomatic relief if beneficial.      Relevant Medications   azithromycin (ZITHROMAX) 250 MG tablet     No orders of the defined types were placed in this encounter.  No orders of the defined types were placed in this encounter.   I discussed the assessment and treatment plan with the patient. The patient was provided an opportunity to ask questions and all were answered. The patient agreed with the plan and demonstrated an understanding of the instructions. The patient was advised to call back or seek an in-person evaluation if the symptoms worsen or if the condition fails to improve as anticipated.  Follow up plan: No follow-ups on file.  Romilda Garret, NP

## 2021-04-16 ENCOUNTER — Other Ambulatory Visit: Payer: BC Managed Care – PPO

## 2021-04-20 LAB — HM MAMMOGRAPHY

## 2021-04-21 ENCOUNTER — Encounter: Payer: BC Managed Care – PPO | Admitting: Family Medicine

## 2021-05-06 ENCOUNTER — Other Ambulatory Visit: Payer: BC Managed Care – PPO

## 2021-05-10 ENCOUNTER — Encounter: Payer: BC Managed Care – PPO | Admitting: Family Medicine

## 2021-05-13 ENCOUNTER — Encounter: Payer: BC Managed Care – PPO | Admitting: Family Medicine

## 2021-06-02 ENCOUNTER — Other Ambulatory Visit: Payer: Self-pay

## 2021-06-02 ENCOUNTER — Encounter: Payer: Self-pay | Admitting: Family Medicine

## 2021-06-02 ENCOUNTER — Ambulatory Visit (INDEPENDENT_AMBULATORY_CARE_PROVIDER_SITE_OTHER): Payer: BC Managed Care – PPO | Admitting: Family Medicine

## 2021-06-02 VITALS — BP 122/76 | HR 74 | Temp 97.8°F | Ht 62.75 in | Wt 137.0 lb

## 2021-06-02 DIAGNOSIS — Z85820 Personal history of malignant melanoma of skin: Secondary | ICD-10-CM | POA: Diagnosis not present

## 2021-06-02 DIAGNOSIS — E2839 Other primary ovarian failure: Secondary | ICD-10-CM

## 2021-06-02 DIAGNOSIS — E538 Deficiency of other specified B group vitamins: Secondary | ICD-10-CM

## 2021-06-02 DIAGNOSIS — M810 Age-related osteoporosis without current pathological fracture: Secondary | ICD-10-CM

## 2021-06-02 DIAGNOSIS — E559 Vitamin D deficiency, unspecified: Secondary | ICD-10-CM

## 2021-06-02 DIAGNOSIS — Z0000 Encounter for general adult medical examination without abnormal findings: Secondary | ICD-10-CM

## 2021-06-02 DIAGNOSIS — N951 Menopausal and female climacteric states: Secondary | ICD-10-CM

## 2021-06-02 MED ORDER — CYANOCOBALAMIN 1000 MCG/ML IJ SOLN
1000.0000 ug | Freq: Once | INTRAMUSCULAR | Status: AC
  Administered 2021-06-02: 1000 ug via INTRAMUSCULAR

## 2021-06-02 NOTE — Assessment & Plan Note (Addendum)
Reviewed health habits including diet and exercise and skin cancer prevention Reviewed appropriate screening tests for age  Also reviewed health mt list, fam hx and immunization status , as well as social and family history   See HPI Labs ordered Mammogram reviewed  Colonoscopy utd  Considering shingrix if affordable  covid immunized  Flu shot planned next week  PHQ score 0 dexa ordered

## 2021-06-02 NOTE — Assessment & Plan Note (Signed)
utd derm care Goes every December  No new issues  Uses sun protection

## 2021-06-02 NOTE — Assessment & Plan Note (Signed)
dexa ordered and pt given info to call and schedule No falls or fractures  Menopausal  Taking vit D /needs to be more compliant  Discussed options for exercise

## 2021-06-02 NOTE — Assessment & Plan Note (Signed)
Overdue for shot -given today  Will hold off on checking level Continue oral supplementation

## 2021-06-02 NOTE — Assessment & Plan Note (Signed)
Doing well  No medications at this time tolerating

## 2021-06-02 NOTE — Patient Instructions (Addendum)
Think about alternative exercise: Water aerobics or swimming, exercise bike and chair yoga   Try to get most of your carbohydrates from produce (with the exception of white potatoes)  Eat less bread/pasta/rice/snack foods/cereals/sweets and other items from the middle of the grocery store (processed carbs)  If you are interested in the shingles vaccine series (Shingrix), call your insurance or pharmacy to check on coverage and location it must be given.  If affordable - you can schedule it here or at your pharmacy depending on coverage   Call and schedule your bone density test in [place]  Please call the location of your choice from the menu below to schedule your Mammogram and/or Bone Density appointment.    [facility]                      Phone:  [phone] [address], [place] [address]                                                             Services: Traditional and 3D Mammogram, [place] [facility]                 Phone: [phone] [address], [place] [address]    Service: Bone Density ONLY   *this site does NOT perform mammograms  [place]                        Phone:  [phone] [address], [place] [address]                                            Services:  3D Mammogram and Why at [facility]   Phone:  [phone]   [place],  [address]  Services: 3D Mammogram and Bone Density  [place] at [facility])  Phone:  [phone]   [address] Room [place], [place] [address]                                              Services:  3D Mammogram and Bone Density

## 2021-06-02 NOTE — Assessment & Plan Note (Signed)
Level today  Has known osteoporosis  Enc better compliance with 5000 iu daily D3 Discussed importance to bone and overall health
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
## 2021-06-03 LAB — CBC WITH DIFFERENTIAL/PLATELET
Basophils Absolute: 0 10*3/uL (ref 0.0–0.2)
Basos: 1 %
EOS (ABSOLUTE): 0 10*3/uL (ref 0.0–0.4)
Eos: 0 %
Hematocrit: 40.5 % (ref 34.0–46.6)
Hemoglobin: 13.5 g/dL (ref 11.1–15.9)
Immature Grans (Abs): 0 10*3/uL (ref 0.0–0.1)
Immature Granulocytes: 0 %
Lymphocytes Absolute: 1.8 10*3/uL (ref 0.7–3.1)
Lymphs: 40 %
MCH: 30.7 pg (ref 26.6–33.0)
MCHC: 33.3 g/dL (ref 31.5–35.7)
MCV: 92 fL (ref 79–97)
Monocytes Absolute: 0.4 10*3/uL (ref 0.1–0.9)
Monocytes: 9 %
Neutrophils Absolute: 2.2 10*3/uL (ref 1.4–7.0)
Neutrophils: 50 %
Platelets: 195 10*3/uL (ref 150–450)
RBC: 4.4 x10E6/uL (ref 3.77–5.28)
RDW: 12.9 % (ref 11.7–15.4)
WBC: 4.5 10*3/uL (ref 3.4–10.8)

## 2021-06-03 LAB — COMPREHENSIVE METABOLIC PANEL
ALT: 15 IU/L (ref 0–32)
AST: 18 IU/L (ref 0–40)
Albumin/Globulin Ratio: 2.1 (ref 1.2–2.2)
Albumin: 4.6 g/dL (ref 3.8–4.9)
Alkaline Phosphatase: 58 IU/L (ref 44–121)
BUN/Creatinine Ratio: 20 (ref 9–23)
BUN: 11 mg/dL (ref 6–24)
Bilirubin Total: 0.5 mg/dL (ref 0.0–1.2)
CO2: 27 mmol/L (ref 20–29)
Calcium: 9.4 mg/dL (ref 8.7–10.2)
Chloride: 103 mmol/L (ref 96–106)
Creatinine, Ser: 0.56 mg/dL — ABNORMAL LOW (ref 0.57–1.00)
Globulin, Total: 2.2 g/dL (ref 1.5–4.5)
Glucose: 86 mg/dL (ref 70–99)
Potassium: 4 mmol/L (ref 3.5–5.2)
Sodium: 143 mmol/L (ref 134–144)
Total Protein: 6.8 g/dL (ref 6.0–8.5)
eGFR: 106 mL/min/{1.73_m2} (ref >59)

## 2021-06-03 LAB — HEMOGLOBIN A1C
Est. average glucose Bld gHb Est-mCnc: 111 mg/dL
Hgb A1c MFr Bld: 5.5 % (ref 4.8–5.6)

## 2021-06-03 LAB — LIPID PANEL
Chol/HDL Ratio: 2.3 ratio (ref 0.0–4.4)
Cholesterol, Total: 180 mg/dL (ref 100–199)
HDL: 77 mg/dL (ref >39)
LDL Chol Calc (NIH): 95 mg/dL (ref 0–99)
Triglycerides: 40 mg/dL (ref 0–149)
VLDL Cholesterol Cal: 8 mg/dL (ref 5–40)

## 2021-06-03 LAB — TSH: TSH: 2.03 u[IU]/mL (ref 0.450–4.500)

## 2021-06-03 LAB — VITAMIN D 25 HYDROXY (VIT D DEFICIENCY, FRACTURES): Vit D, 25-Hydroxy: 37.3 ng/mL (ref 30.0–100.0)

## 2021-06-30 ENCOUNTER — Other Ambulatory Visit: Payer: Self-pay

## 2021-06-30 ENCOUNTER — Ambulatory Visit
Admission: RE | Admit: 2021-06-30 | Discharge: 2021-06-30 | Disposition: A | Discharge status: Home / Self Care | Payer: BC Managed Care – PPO | Source: Ambulatory Visit | Attending: Family Medicine | Admitting: Family Medicine

## 2021-06-30 DIAGNOSIS — E2839 Other primary ovarian failure: Secondary | ICD-10-CM | POA: Diagnosis not present

## 2021-07-30 ENCOUNTER — Telehealth (INDEPENDENT_AMBULATORY_CARE_PROVIDER_SITE_OTHER): Payer: BC Managed Care – PPO | Admitting: Nurse Practitioner

## 2021-07-30 ENCOUNTER — Other Ambulatory Visit: Payer: Self-pay

## 2021-07-30 DIAGNOSIS — J0100 Acute maxillary sinusitis, unspecified: Secondary | ICD-10-CM | POA: Diagnosis not present

## 2021-07-30 MED ORDER — DOXYCYCLINE HYCLATE 100 MG PO TABS: 100.0000 mg | ORAL_TABLET | Freq: Two times a day (BID) | ORAL | 0 refills | Status: AC

## 2021-07-30 NOTE — Assessment & Plan Note (Addendum)
Discussed watchful waiting with patient and not filling abx until Monday if she is not improving or gets worse. Doxycycline 100 mg twice daily for 7 days sent to pharmacy. ED precautions discussed  Continue using warm compresses and gentle massage with eyes matting.  If she develops symptoms consistent with conjunctivitis she will reach out

## 2021-07-30 NOTE — Progress Notes (Signed)
Patient ID: Robin Henson, female    DOB: Jan 18, 1961, 60 y.o.   MRN: [id number]  Virtual visit completed through [place], a video enabled telemedicine application. Due to national recommendations of social distancing due to COVID-19, a virtual visit is felt to be most appropriate for this patient at this time. Reviewed limitations, risks, security and privacy concerns of performing a virtual visit and the availability of in person appointments. I also reviewed that there may be a patient responsible charge related to this service. The patient agreed to proceed.   Patient location: home Provider location: [place] at [facility], office Persons participating in this virtual visit: patient, provider   If any vitals were documented, they were collected by patient at home unless specified below.    There were no vitals taken for this visit.   CC: Sinus problem Subjective:   HPI: Robin Henson is a 60 y.o. female presenting on 07/30/2021 for Sinus Problem (Started on 07/26/21, yesterday woke up with eyes matted and this morning, teeth pain, facial pain/soreness, really sore below eyes/cheeks area, runny nose. No sore throat, cough or fever. has hx of sinus infection. Has taking Tylenol sinus and uses Flonase daily. Covid test negative on 07/29/21)  Symptoms 07/26/2021 Covid test yesterday and was negative Moderna x2 and one booster No sick contacts OTC tylenol sinus and flonase    Relevant past medical, surgical, family and social history reviewed and updated as indicated. Interim medical history since our last visit reviewed. Allergies and medications reviewed and updated. Outpatient Medications Prior to Visit  Medication Sig Dispense Refill   Acetaminophen (TYLENOL ARTHRITIS PAIN PO) Take 2 tablets by mouth as needed.     cholecalciferol (VITAMIN D3) 25 MCG (1000 UT) tablet Take 5,000 Units by mouth daily.     Cyanocobalamin (VITAMIN B-12 IJ) Inject as directed. Every 6 weeks      Cyanocobalamin (VITAMIN B12 PO) Take 1,000 mcg by mouth daily.      cyclobenzaprine (FLEXERIL) 5 MG tablet Take 1 tablet (5 mg total) by mouth at bedtime. For restless legs 30 tablet 1   fluticasone (FLONASE) 50 MCG/ACT nasal spray PLACE 2 SPRAYS INTO BOTH NOSTRILS DAILY AS NEEDED FOR ALLERGIES OR RHINITIS. 48 mL 1   ibuprofen (ADVIL) 200 MG tablet Take 200 mg by mouth every 6 (six) hours as needed.     OVER THE COUNTER MEDICATION Leaky Gut powder to put in the water daily     Probiotic Product (PROBIOTIC-10 PO) Take by mouth. 2 daily     No facility-administered medications prior to visit.     Per HPI unless specifically indicated in ROS section below Review of Systems  Constitutional:  Negative for chills, fatigue and fever.  HENT:  Positive for postnasal drip, rhinorrhea, sinus pressure and sinus pain. Negative for congestion, ear discharge, ear pain and sore throat.   Eyes:  Negative for pain, redness, itching and visual disturbance.       Eye matted  Respiratory:  Negative for cough and shortness of breath.   Cardiovascular:  Negative for chest pain.  Gastrointestinal:  Negative for abdominal pain, diarrhea, nausea and vomiting.  Musculoskeletal:  Negative for arthralgias and myalgias.  Neurological:  Positive for headaches.  Objective:  There were no vitals taken for this visit.  Wt Readings from Last 3 Encounters:  06/02/21 137 lb (62.1 kg)  04/15/21 133 lb (60.3 kg)  09/10/20 134 lb 4 oz (60.9 kg)       Physical exam:  Gen: alert, NAD, not ill appearing Pulm: speaks in complete sentences without increased work of breathing Psych: normal mood, normal thought content      Results for orders placed or performed in visit on 06/02/21  HM MAMMOGRAPHY  Result Value Ref Range   HM Mammogram 0-4 Bi-Rad 0-4 Bi-Rad, Self Reported Normal  CBC with Differential/Platelet  Result Value Ref Range   WBC 4.5 3.4 - 10.8 x10E3/uL   RBC 4.40 3.77 - 5.28 x10E6/uL   Hemoglobin 13.5  11.1 - 15.9 g/dL   Hematocrit 40.5 34.0 - 46.6 %   MCV 92 79 - 97 fL   MCH 30.7 26.6 - 33.0 pg   MCHC 33.3 31.5 - 35.7 g/dL   RDW 12.9 11.7 - 15.4 %   Platelets 195 150 - 450 x10E3/uL   Neutrophils 50 Not Estab. %   Lymphs 40 Not Estab. %   Monocytes 9 Not Estab. %   Eos 0 Not Estab. %   Basos 1 Not Estab. %   Neutrophils Absolute 2.2 1.4 - 7.0 x10E3/uL   Lymphocytes Absolute 1.8 0.7 - 3.1 x10E3/uL   Monocytes Absolute 0.4 0.1 - 0.9 x10E3/uL   EOS (ABSOLUTE) 0.0 0.0 - 0.4 x10E3/uL   Basophils Absolute 0.0 0.0 - 0.2 x10E3/uL   Immature Granulocytes 0 Not Estab. %   Immature Grans (Abs) 0.0 0.0 - 0.1 x10E3/uL  Comprehensive metabolic panel  Result Value Ref Range   Glucose 86 70 - 99 mg/dL   BUN 11 6 - 24 mg/dL   Creatinine, Ser 0.56 (L) 0.57 - 1.00 mg/dL   eGFR 106 >59 mL/min/1.73   BUN/Creatinine Ratio 20 9 - 23   Sodium 143 134 - 144 mmol/L   Potassium 4.0 3.5 - 5.2 mmol/L   Chloride 103 96 - 106 mmol/L   CO2 27 20 - 29 mmol/L   Calcium 9.4 8.7 - 10.2 mg/dL   Total Protein 6.8 6.0 - 8.5 g/dL   Albumin 4.6 3.8 - 4.9 g/dL   Globulin, Total 2.2 1.5 - 4.5 g/dL   Albumin/Globulin Ratio 2.1 1.2 - 2.2   Bilirubin Total 0.5 0.0 - 1.2 mg/dL   Alkaline Phosphatase 58 44 - 121 IU/L   AST 18 0 - 40 IU/L   ALT 15 0 - 32 IU/L  Hemoglobin A1c  Result Value Ref Range   Hgb A1c MFr Bld 5.5 4.8 - 5.6 %   Est. average glucose Bld gHb Est-mCnc 111 mg/dL  Lipid panel  Result Value Ref Range   Cholesterol, Total 180 100 - 199 mg/dL   Triglycerides 40 0 - 149 mg/dL   HDL 77 >39 mg/dL   VLDL Cholesterol Cal 8 5 - 40 mg/dL   LDL Chol Calc (NIH) 95 0 - 99 mg/dL   Chol/HDL Ratio 2.3 0.0 - 4.4 ratio  TSH  Result Value Ref Range   TSH 2.030 0.450 - 4.500 uIU/mL  VITAMIN D 25 Hydroxy (Vit-D Deficiency, Fractures)  Result Value Ref Range   Vit D, 25-Hydroxy 37.3 30.0 - 100.0 ng/mL   Assessment & Plan:   Problem List Items Addressed This Visit       Respiratory   Acute non-recurrent  maxillary sinusitis - Primary     Discussed watchful waiting with patient and not filling abx until Monday if she is not improving or gets worse. Doxycycline 100 mg twice daily for 7 days sent to pharmacy. ED precautions discussed  Continue using warm compresses and gentle massage with eyes matting.  If  she develops symptoms consistent with conjunctivitis she will reach out      Relevant Medications   doxycycline (VIBRA-TABS) 100 MG tablet    No orders of the defined types were placed in this encounter.  No orders of the defined types were placed in this encounter.   I discussed the assessment and treatment plan with the patient. The patient was provided an opportunity to ask questions and all were answered. The patient agreed with the plan and demonstrated an understanding of the instructions. The patient was advised to call back or seek an in-person evaluation if the symptoms worsen or if the condition fails to improve as anticipated.  Follow up plan: No follow-ups on file.  Romilda Garret, NP

## 2021-08-24 ENCOUNTER — Other Ambulatory Visit: Payer: Self-pay

## 2021-08-24 ENCOUNTER — Ambulatory Visit (INDEPENDENT_AMBULATORY_CARE_PROVIDER_SITE_OTHER): Payer: BC Managed Care – PPO

## 2021-08-24 DIAGNOSIS — E538 Deficiency of other specified B group vitamins: Secondary | ICD-10-CM | POA: Diagnosis not present

## 2021-08-24 MED ORDER — CYANOCOBALAMIN 1000 MCG/ML IJ SOLN
1000.0000 ug | Freq: Once | INTRAMUSCULAR | Status: AC
  Administered 2021-08-24: 1000 ug via INTRAMUSCULAR

## 2021-08-24 NOTE — Progress Notes (Signed)
Per orders of Dr. Glori Bickers, injection of every 6 weeks B12 1000 mcg/ml given by Beatriz Stallion, CMA in Left Deltoid at pts request. Patient tolerated injection well.

## 2021-09-02 ENCOUNTER — Telehealth (INDEPENDENT_AMBULATORY_CARE_PROVIDER_SITE_OTHER): Payer: BC Managed Care – PPO | Admitting: Family Medicine

## 2021-09-02 ENCOUNTER — Telehealth: Payer: Self-pay | Admitting: Family Medicine

## 2021-09-02 ENCOUNTER — Encounter: Payer: Self-pay | Admitting: Family Medicine

## 2021-09-02 ENCOUNTER — Other Ambulatory Visit: Payer: Self-pay

## 2021-09-02 VITALS — BP 120/80 | HR 64 | Temp 97.1°F | Ht 62.75 in | Wt 141.0 lb

## 2021-09-02 DIAGNOSIS — J069 Acute upper respiratory infection, unspecified: Secondary | ICD-10-CM | POA: Diagnosis not present

## 2021-09-02 DIAGNOSIS — K121 Other forms of stomatitis: Secondary | ICD-10-CM | POA: Diagnosis not present

## 2021-09-02 MED ORDER — MAGIC MOUTHWASH W/LIDOCAINE: ORAL | 0 refills | Status: DC

## 2021-09-02 NOTE — Telephone Encounter (Signed)
Robin Henson ended up calling it in and I shredded the printed px  Thanks
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
## 2021-09-02 NOTE — Telephone Encounter (Signed)
That was sent in today after her visit. Let me know if there are any problems

## 2021-09-02 NOTE — Telephone Encounter (Signed)
°  Encourage patient to contact the pharmacy for refills or they can request refills through [place]:  Please schedule appointment if longer than 1 year  NEXT APPOINTMENT DATE:  MEDICATION:magic mouthwash w/lidocaine SOLN  Is the patient out of medication?   PHARMACY:[id number] [address], [place]  Let patient know to contact pharmacy at the end of the day to make sure medication is ready.  Please notify patient to allow 48-72 hours to process  CLINICAL FILLS OUT ALL BELOW:   LAST REFILL:  QTY:  REFILL DATE:    OTHER COMMENTS:    Okay for refill?  Please advise

## 2021-09-02 NOTE — Patient Instructions (Addendum)
Tylenol and /or ibuprofen is ok  Use the magic mouthwash Nasal saline spray if needed   Continue flonase   If cough- try mucinex DM   Update if not starting to improve in a week or if worsening    If severe - trouble breathing or throat closing go to the ER If throat pain is too severe to swallow saliva -go to the ER

## 2021-09-02 NOTE — Telephone Encounter (Signed)
Looking at Rx it is set to "print" I just want to clarify did you print out the Rx and give to pt, if not please re-send electronically

## 2021-09-03 NOTE — Assessment & Plan Note (Signed)
Mild with some mouth and throat ulcers  Neg covid testing No fever-doubt flu  Discussed symptom care  Ibuprofen/tylenol Nasal saline Sent in magic mouthwash for throat/mouth ER precautions reviewed  Update if not starting to improve in a week or if worsening

## 2021-09-03 NOTE — Assessment & Plan Note (Signed)
With viral uri Painful but can swallow  No hand/foot lesions-will continue to watch  Magic mouthwash sent to pharmacy with instructions Update if not starting to improve in a week or if worsening   ER precautions reviewed

## 2021-09-06 ENCOUNTER — Encounter: Payer: Self-pay | Admitting: Family Medicine

## 2021-09-06 ENCOUNTER — Telehealth: Payer: Self-pay | Admitting: Family Medicine

## 2021-09-06 NOTE — Telephone Encounter (Signed)
Pt scheduled appointment tomorrow to see dr tower

## 2021-09-06 NOTE — Telephone Encounter (Signed)
Pt called  stating that Dr Glori Bickers stated that if she didn't feel any better to let her know. Pt states that she is not feeling any better and that the mouth wash isn't helping the pain. Pt states that now she is having pain in her neck and left ear. Pt states her tongue is yellow and the back of her throat is white. Please advise.

## 2021-09-06 NOTE — Telephone Encounter (Signed)
Appt has been scheduled for 09/07/21

## 2021-09-06 NOTE — Telephone Encounter (Signed)
Per Dr. Glori Bickers:  Please set her up with in office visit with first available.  Needs to be checked. ? Possible thrush in addition to what she is already dealing with

## 2021-09-07 ENCOUNTER — Other Ambulatory Visit: Payer: Self-pay

## 2021-09-07 ENCOUNTER — Ambulatory Visit: Payer: BC Managed Care – PPO | Admitting: Family Medicine

## 2021-09-07 ENCOUNTER — Encounter: Payer: Self-pay | Admitting: Family Medicine

## 2021-09-07 VITALS — BP 126/76 | HR 78 | Temp 97.2°F | Ht 62.75 in | Wt 142.5 lb

## 2021-09-07 DIAGNOSIS — H6982 Other specified disorders of Eustachian tube, left ear: Secondary | ICD-10-CM

## 2021-09-07 DIAGNOSIS — K121 Other forms of stomatitis: Secondary | ICD-10-CM | POA: Diagnosis not present

## 2021-09-07 DIAGNOSIS — B370 Candidal stomatitis: Secondary | ICD-10-CM

## 2021-09-07 DIAGNOSIS — H6990 Unspecified Eustachian tube disorder, unspecified ear: Secondary | ICD-10-CM | POA: Insufficient documentation

## 2021-09-07 DIAGNOSIS — H6992 Unspecified Eustachian tube disorder, left ear: Secondary | ICD-10-CM

## 2021-09-07 DIAGNOSIS — H6980 Other specified disorders of Eustachian tube, unspecified ear: Secondary | ICD-10-CM | POA: Insufficient documentation

## 2021-09-07 MED ORDER — NYSTATIN 100000 UNIT/ML MT SUSP: OROMUCOSAL | 1 refills | Status: DC

## 2021-09-07 NOTE — Patient Instructions (Addendum)
Switch out oral appliances/toothbrush frequently until better  Use the nystatin oral solution four times daily (swish and swallow one teaspoon)   Drink fluids also  Take care of yourself   Update if not starting to improve in a week or if worsening   If worse please alert [place]  If you have trouble swallowing go to the ER  Continue flonase - that will help ear  Always gargle with water after you use it

## 2021-09-07 NOTE — Assessment & Plan Note (Signed)
L TM-effusion in setting of uri  Enc use of flonase ns daily  Watch for pain/fever or sinus pain or hearing change Update if not starting to improve in a week or if worsening

## 2021-09-07 NOTE — Assessment & Plan Note (Signed)
Primarily on tongue  In setting of recent mouth ulcer and uri symptoms  Px nystatin soln to swish and swallow QID until clear Will change oral appliance/tooth brush ER precautions discussed  Update if not starting to improve in a week or if worsening  Handout given Meds ordered this encounter  Medications   nystatin (MYCOSTATIN) 100000 UNIT/ML suspension    Sig: Swish and swallow 1 teaspoon four times daily    Dispense:  120 mL    Refill:  1

## 2021-09-07 NOTE — Assessment & Plan Note (Signed)
Ulcer on L lateral tongue  Now complicated by thrush noted on tongue  Px nystatin oral susp Update if not starting to improve in a week or if worsening    Eat bland/avoid spice/salt/acid
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
## 2021-09-21 ENCOUNTER — Telehealth: Payer: Self-pay | Admitting: Family Medicine

## 2021-09-21 NOTE — Telephone Encounter (Signed)
I spoke with Robin Henson; Robin Henson said symptoms started on 2 wks ago with thrush.on 09/17/21 Robin Henson noticed tongue being yellow again and 2 white spots on back of throat on lt side. Robin Henson said sorethroat that is scratchy when swallows.stopped nystatin on 09/15/21. Robin Henson still has another bottle of nystatin and wants to know if should finish it or not since has white spots on back of throat and Robin Henson has sinus issues. When blows nose has green mucus. A lot of sinus drainage. No fever. Slight h/a on and off. Slight dry cough is on and off. No SOB. Robin Henson has had runny nose on rt side of nose.[facility].  Sending note to Dr Glori Bickers and Williams CMA.

## 2021-09-21 NOTE — Telephone Encounter (Signed)
[facility] - Client TELEPHONE ADVICE RECORD [facility] Patient Name: Robin Henson Gender: Female DOB: Nov 24, 1960 Age: 61 Y 74 M 24 D Return Phone Number: [phone] (Primary), [phone] (Secondary) Address: City/ State/ Zip: [place] [address] Client [facility] - Client Client Site [place] Provider Glori Bickers, Roque Lias - MD Contact Type Call Who Is Calling Patient / Member / Family / Caregiver Call Type Triage / Clinical Relationship To Patient Self Return Phone Number [phone] (Primary) Chief Complaint Swallowing Difficulty Reason for Call Symptomatic / Request for [place] states she has two white spots on the back of her throat. She was diagnosed with thrush and is wondering if she should continue treatment. She has sinus greenish snot. It hurts to swallow. Translation No Nurse Assessment Nurse: Kirk Ruths, RN, [place] [facility] Date/Time (Eastern Time): 09/21/2021 11:39:53 AM Confirm and document reason for call. If symptomatic, describe symptoms. ---Caller states she has dx of thrush has white spots on throat has improved but is not completely gone also having sinus drainage no fever has stopped the nystatin she was unsure if she was supposed to use the whole prescription Does the patient have any new or worsening symptoms? ---Yes Will a triage be completed? ---Yes Related visit to physician within the last 2 weeks? ---Yes Does the PT have any chronic conditions? (i.e. diabetes, asthma, this includes High risk factors for pregnancy, etc.) ---No Is this a behavioral health or substance abuse call? ---No Guidelines Guideline Title Affirmed Question Affirmed Notes Nurse Date/Time (Eastern Time) Infection on Antibiotic Follow-up Call [1] Caller has URGENT question AND [2] triager unable to answer question Kirk Ruths, RN, [place] [facility] 09/21/2021  11:43:14 AM Disp. Time Eilene Ghazi Time) Disposition Final User PLEASE NOTE: All timestamps contained within this report are represented as [place] Standard Time. CONFIDENTIALTY NOTICE: This fax transmission is intended only for the addressee. It contains information that is legally privileged, confidential or otherwise protected from use or disclosure. If you are not the intended recipient, you are strictly prohibited from reviewing, disclosing, copying using or disseminating any of this information or taking any action in reliance on or regarding this information. If you have received this fax in error, please notify us immediately by telephone so that we can arrange for its return to [place]. Phone: [phone], Toll-Free: [phone], Fax: [phone] Page: 2 of 2 Call Id: [id number] 09/21/2021 11:44:12 AM Call PCP Now Yes Kirk Ruths, RN, Liana Gerold Disagree/Comply Comply Caller Understands Yes PreDisposition Call Doctor Care Advice Given Per Guideline CALL PCP NOW: * You need to discuss this with your doctor (or NP/PA). * I'll page the on-call provider now. If you haven't heard from the provider (or me) within 30 minutes, call again. PAIN AND FEVER MEDICINES: * For pain or fever relief, take either acetaminophen or ibuprofen. * ACETAMINOPHEN - REGULAR STRENGTH TYLENOL: Take 650 mg (two 325 mg pills) by mouth every 4 to 6 hours as needed. Each Regular Strength Tylenol pill has 325 mg of acetaminophen. The most you should take is 10 pills a day (3,250 mg total). Note: In [place], the maximum is 12 pills a day (3,900 mg total). CALL BACK IF: * You have more questions or concerns * You become worse CARE ADVICE given per Infection on Antibiotics Follow-Up Call (Adult) guideline. Comments User: Tawni Levy, RN Date/Time Eilene Ghazi Time): 09/21/2021 11:48:41 AM Caller made aware that office nurse will call her back with instructions after speaking with MD  Referrals REFERRED TO PCP OFFIC

## 2021-09-21 NOTE — Telephone Encounter (Signed)
[facility] access nurse called  stating that pt states that she only took half of medication nystatin (MYCOSTATIN) 100000 UNIT/ML suspension for her thrust. [place] [employer] states that pt states that her tongue is still white. [place] [employer] states that pt is asking should she take the rest of the medication or do she need to come in for an appt. Please advise. [place] [employer] states that you can call pt with information.

## 2021-09-21 NOTE — Telephone Encounter (Signed)
Go ahead and use the nystatin   Schedule an appt with me for the uri symptoms

## 2021-09-22 NOTE — Telephone Encounter (Signed)
Pt notified of Dr. Marliss Coots comments and verbalized understanding. Pt said she woke up today and the white spots have improved some and her URI sxs are improving also. Pt declined and appt she said she will keep an eye on her sxs and call back if she needs an appt but for now she's okay

## 2021-10-21 ENCOUNTER — Other Ambulatory Visit: Payer: Self-pay | Admitting: Family Medicine

## 2021-11-11 ENCOUNTER — Ambulatory Visit (INDEPENDENT_AMBULATORY_CARE_PROVIDER_SITE_OTHER): Payer: BC Managed Care – PPO | Admitting: Family

## 2021-11-11 VITALS — BP 122/62 | HR 64 | Temp 98.0°F | Resp 16 | Ht 62.75 in | Wt 140.6 lb

## 2021-11-11 DIAGNOSIS — R300 Dysuria: Secondary | ICD-10-CM | POA: Insufficient documentation

## 2021-11-11 DIAGNOSIS — N3000 Acute cystitis without hematuria: Secondary | ICD-10-CM | POA: Insufficient documentation

## 2021-11-11 LAB — POCT URINALYSIS DIPSTICK
Bilirubin, UA: NEGATIVE
Blood, UA: NEGATIVE
Glucose, UA: NEGATIVE
Ketones, UA: NEGATIVE
Leukocytes, UA: NEGATIVE
Nitrite, UA: POSITIVE
Protein, UA: NEGATIVE
Spec Grav, UA: 1.015 (ref 1.010–1.025)
Urobilinogen, UA: 1 E.U./dL
pH, UA: 6 (ref 5.0–8.0)

## 2021-11-11 MED ORDER — CEFIXIME 400 MG PO CAPS: 400.0000 mg | ORAL_CAPSULE | Freq: Every day | ORAL | 0 refills | Status: AC

## 2021-11-11 NOTE — Assessment & Plan Note (Signed)
poct urine dipstick reviewed ?Urinalysis ordered ?Urine culture ordered  ?Pending results ?
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
## 2021-11-11 NOTE — Patient Instructions (Signed)
It was a pleasure seeing you today.   You were found to have a urinary tract infection, you have been prescribed an antibiotic to your preferred pharmacy. Please start antibiotic today as directed.   We are sending your urine for a culture to make sure you do not have a resistant bacteria. We will call you if we need to change your medications.   Please make sure you are drinking plenty of fluids over the next few days.  If your symptoms do not improve over the next 5-7 days, or if they worsen, please let us know. Please also let us know if you have worsening back pain, fevers, chills, or body aches.   Regards,   Alailah Safley  

## 2021-11-11 NOTE — Assessment & Plan Note (Signed)
antbx sent to pharmacy, pt to take as directed. Encouraged increased water intake throughout the day. Urine culture/reflex pending results. Choosing to treat due to being symptomatic. If no improvement in the next 2 days pt advised to let me know.  

## 2021-11-12 LAB — MICROSCOPIC EXAMINATION
Bacteria, UA: NONE SEEN
Casts: NONE SEEN /lpf
Epithelial Cells (non renal): NONE SEEN /hpf (ref 0–10)
RBC, Urine: NONE SEEN /hpf (ref 0–2)
WBC, UA: NONE SEEN /hpf (ref 0–5)

## 2021-11-12 LAB — URINALYSIS, ROUTINE W REFLEX MICROSCOPIC
Bilirubin, UA: NEGATIVE
Glucose, UA: NEGATIVE
Ketones, UA: NEGATIVE
Leukocytes,UA: NEGATIVE
Nitrite, UA: POSITIVE — AB
Protein,UA: NEGATIVE
RBC, UA: NEGATIVE
Specific Gravity, UA: 1.014 (ref 1.005–1.030)
Urobilinogen, Ur: 1 mg/dL (ref 0.2–1.0)
pH, UA: 6.5 (ref 5.0–7.5)

## 2021-11-13 LAB — URINE CULTURE

## 2021-12-09 ENCOUNTER — Ambulatory Visit: Payer: BC Managed Care – PPO

## 2022-04-18 ENCOUNTER — Other Ambulatory Visit: Payer: Self-pay | Admitting: Family Medicine

## 2022-04-28 LAB — HM MAMMOGRAPHY

## 2022-06-06 ENCOUNTER — Telehealth: Payer: Self-pay | Admitting: Family Medicine

## 2022-06-06 DIAGNOSIS — Z0000 Encounter for general adult medical examination without abnormal findings: Secondary | ICD-10-CM

## 2022-06-06 DIAGNOSIS — M810 Age-related osteoporosis without current pathological fracture: Secondary | ICD-10-CM

## 2022-06-06 DIAGNOSIS — E559 Vitamin D deficiency, unspecified: Secondary | ICD-10-CM

## 2022-06-06 DIAGNOSIS — E538 Deficiency of other specified B group vitamins: Secondary | ICD-10-CM

## 2022-06-06 NOTE — Telephone Encounter (Signed)
-----   Message from Ellamae Sia sent at 05/23/2022 11:54 AM EDT ----- Regarding: Lab orders for Tuesday, 10.31.23 Patient is scheduled for CPX labs, please order future labs, Thanks , Karna Christmas

## 2022-06-08 ENCOUNTER — Other Ambulatory Visit (INDEPENDENT_AMBULATORY_CARE_PROVIDER_SITE_OTHER): Payer: BC Managed Care – PPO

## 2022-06-08 DIAGNOSIS — E538 Deficiency of other specified B group vitamins: Secondary | ICD-10-CM

## 2022-06-08 DIAGNOSIS — Z0000 Encounter for general adult medical examination without abnormal findings: Secondary | ICD-10-CM

## 2022-06-08 DIAGNOSIS — E559 Vitamin D deficiency, unspecified: Secondary | ICD-10-CM

## 2022-06-08 NOTE — Addendum Note (Signed)
Addended by: Ellamae Sia on: 06/08/2022 08:29 AM   Modules accepted: Orders

## 2022-06-09 LAB — VITAMIN B12: Vitamin B-12: 400 pg/mL (ref 232–1245)

## 2022-06-09 LAB — CBC WITH DIFFERENTIAL/PLATELET
Basophils Absolute: 0 10*3/uL (ref 0.0–0.2)
Basos: 1 %
EOS (ABSOLUTE): 0 10*3/uL (ref 0.0–0.4)
Eos: 1 %
Hematocrit: 41.6 % (ref 34.0–46.6)
Hemoglobin: 14.4 g/dL (ref 11.1–15.9)
Immature Grans (Abs): 0 10*3/uL (ref 0.0–0.1)
Immature Granulocytes: 1 %
Lymphocytes Absolute: 1.7 10*3/uL (ref 0.7–3.1)
Lymphs: 44 %
MCH: 32 pg (ref 26.6–33.0)
MCHC: 34.6 g/dL (ref 31.5–35.7)
MCV: 92 fL (ref 79–97)
Monocytes Absolute: 0.3 10*3/uL (ref 0.1–0.9)
Monocytes: 9 %
Neutrophils Absolute: 1.7 10*3/uL (ref 1.4–7.0)
Neutrophils: 44 %
Platelets: 202 10*3/uL (ref 150–450)
RBC: 4.5 x10E6/uL (ref 3.77–5.28)
RDW: 12.5 % (ref 11.7–15.4)
WBC: 3.8 10*3/uL (ref 3.4–10.8)

## 2022-06-09 LAB — COMPREHENSIVE METABOLIC PANEL
ALT: 15 IU/L (ref 0–32)
AST: 19 IU/L (ref 0–40)
Albumin/Globulin Ratio: 2.2 (ref 1.2–2.2)
Albumin: 4.6 g/dL (ref 3.8–4.9)
Alkaline Phosphatase: 57 IU/L (ref 44–121)
BUN/Creatinine Ratio: 19 (ref 9–23)
BUN: 12 mg/dL (ref 6–24)
Bilirubin Total: 0.4 mg/dL (ref 0.0–1.2)
CO2: 29 mmol/L (ref 20–29)
Calcium: 9.4 mg/dL (ref 8.7–10.2)
Chloride: 103 mmol/L (ref 96–106)
Creatinine, Ser: 0.63 mg/dL (ref 0.57–1.00)
Globulin, Total: 2.1 g/dL (ref 1.5–4.5)
Glucose: 84 mg/dL (ref 70–99)
Potassium: 4.2 mmol/L (ref 3.5–5.2)
Sodium: 142 mmol/L (ref 134–144)
Total Protein: 6.7 g/dL (ref 6.0–8.5)
eGFR: 102 mL/min/{1.73_m2} (ref >59)

## 2022-06-09 LAB — LIPID PANEL
Chol/HDL Ratio: 2.6 ratio (ref 0.0–4.4)
Cholesterol, Total: 197 mg/dL (ref 100–199)
HDL: 77 mg/dL (ref >39)
LDL Chol Calc (NIH): 108 mg/dL — ABNORMAL HIGH (ref 0–99)
Triglycerides: 66 mg/dL (ref 0–149)
VLDL Cholesterol Cal: 12 mg/dL (ref 5–40)

## 2022-06-09 LAB — VITAMIN D 25 HYDROXY (VIT D DEFICIENCY, FRACTURES): Vit D, 25-Hydroxy: 25.2 ng/mL — ABNORMAL LOW (ref 30.0–100.0)

## 2022-06-09 LAB — TSH: TSH: 1.79 u[IU]/mL (ref 0.450–4.500)

## 2022-06-15 ENCOUNTER — Encounter: Payer: Self-pay | Admitting: Family Medicine

## 2022-06-15 ENCOUNTER — Ambulatory Visit (INDEPENDENT_AMBULATORY_CARE_PROVIDER_SITE_OTHER): Payer: BC Managed Care – PPO | Admitting: Family Medicine

## 2022-06-15 VITALS — BP 98/60 | HR 76 | Temp 98.0°F | Ht 62.5 in | Wt 142.1 lb

## 2022-06-15 DIAGNOSIS — Z0000 Encounter for general adult medical examination without abnormal findings: Secondary | ICD-10-CM

## 2022-06-15 DIAGNOSIS — M810 Age-related osteoporosis without current pathological fracture: Secondary | ICD-10-CM

## 2022-06-15 DIAGNOSIS — E785 Hyperlipidemia, unspecified: Secondary | ICD-10-CM | POA: Insufficient documentation

## 2022-06-15 DIAGNOSIS — E7800 Pure hypercholesterolemia, unspecified: Secondary | ICD-10-CM

## 2022-06-15 DIAGNOSIS — H9193 Unspecified hearing loss, bilateral: Secondary | ICD-10-CM

## 2022-06-15 DIAGNOSIS — Z780 Asymptomatic menopausal state: Secondary | ICD-10-CM

## 2022-06-15 DIAGNOSIS — E538 Deficiency of other specified B group vitamins: Secondary | ICD-10-CM

## 2022-06-15 DIAGNOSIS — E559 Vitamin D deficiency, unspecified: Secondary | ICD-10-CM

## 2022-06-15 DIAGNOSIS — H9190 Unspecified hearing loss, unspecified ear: Secondary | ICD-10-CM | POA: Insufficient documentation

## 2022-06-15 DIAGNOSIS — Z85820 Personal history of malignant melanoma of skin: Secondary | ICD-10-CM

## 2022-06-15 DIAGNOSIS — N951 Menopausal and female climacteric states: Secondary | ICD-10-CM

## 2022-06-15 DIAGNOSIS — N6019 Diffuse cystic mastopathy of unspecified breast: Secondary | ICD-10-CM

## 2022-06-15 DIAGNOSIS — H6120 Impacted cerumen, unspecified ear: Secondary | ICD-10-CM | POA: Insufficient documentation

## 2022-06-15 DIAGNOSIS — H6121 Impacted cerumen, right ear: Secondary | ICD-10-CM

## 2022-06-15 MED ORDER — CYANOCOBALAMIN 1000 MCG/ML IJ SOLN
1000.0000 ug | Freq: Once | INTRAMUSCULAR | Status: AC
  Administered 2022-06-15: 1000 ug via INTRAMUSCULAR

## 2022-06-15 NOTE — Assessment & Plan Note (Addendum)
Lab Results  Component Value Date   VITAMINB12 400 06/08/2022   Enc her to continue shots every 6 wk Given today

## 2022-06-15 NOTE — Assessment & Plan Note (Signed)
More symptoms  Ref to GYN to disc tx opt

## 2022-06-15 NOTE — Assessment & Plan Note (Signed)
Reviewed health habits including diet and exercise and skin cancer prevention Reviewed appropriate screening tests for age  Also reviewed health mt list, fam hx and immunization status , as well as social and family history   See HPI Labs reviewed  Flu shot declined, she may get later Shingrix declined Dexa utd Mammogram utd Declines OP treatment  Disc imp of exercise  Enc to get back on vit D Colonoscopy utd 2016  Derm f/u is up to date

## 2022-06-15 NOTE — Assessment & Plan Note (Signed)
Up to date with routine derm visit from April  Uses sun screen

## 2022-06-15 NOTE — Assessment & Plan Note (Signed)
In R ear Inst to get debrox solution otc and use weekly or more often

## 2022-06-15 NOTE — Assessment & Plan Note (Signed)
D level is low 25.2 She is not compliant with dosing  Enc her strongly to get back on D3 5000 iu daily   Disc imp to bone and overall health

## 2022-06-15 NOTE — Assessment & Plan Note (Signed)
Utd with surgeon visit and imaging  [place] due in 5 months  Sees Dr Bary Castilla   Mam/us printed from care everywhere to scan in chart
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
## 2022-06-15 NOTE — Assessment & Plan Note (Signed)
Disc goals for lipids and reasons to control them Rev last labs with pt Rev low sat fat diet in detail LDL is up to 108  Disc dietary change

## 2022-06-15 NOTE — Assessment & Plan Note (Signed)
Pt notices bilateral  Some cerumen on R -disc tx  Ref to audiology for further eval  She is open to hearing aides

## 2022-06-15 NOTE — Assessment & Plan Note (Signed)
Referral to gyn to disc treatment options

## 2022-06-15 NOTE — Patient Instructions (Addendum)
Keep walking ! Strength train as you can  Consider a trainer to help you out  Light weights and exercise bands are helpful with videos  Get your muscle back!   Don't wait very long for flu shot   B12 shot today   Get back on your vitamin D3   5000 iu daily   Keep a days of the week pill box   I will work on a gyn referral in [place] or [place] to discuss hormones    For cholesterol Avoid red meat/ fried foods/ egg yolks/ fatty breakfast meats/ butter, cheese and high fat dairy/ and shellfish    You have ear wax in right ear  Buy debrox over the counter and use it weekly  I will do an audiology referral- you will get a call    If you don't get a call in 1-2 weeks about the referrals let us know   Take care of yourself

## 2022-06-15 NOTE — Assessment & Plan Note (Signed)
Pt declines treatment  Not compliant with vit D - discussed using a pill box  Dexa 06/2021 rev  No falls or fx Enc to make a plan for strength training

## 2022-06-23 ENCOUNTER — Ambulatory Visit: Payer: BC Managed Care – PPO | Admitting: Radiology

## 2022-06-23 ENCOUNTER — Encounter: Payer: Self-pay | Admitting: Radiology

## 2022-06-23 VITALS — BP 122/74 | Ht 62.5 in | Wt 143.0 lb

## 2022-06-23 DIAGNOSIS — N951 Menopausal and female climacteric states: Secondary | ICD-10-CM | POA: Diagnosis not present

## 2022-06-23 DIAGNOSIS — Z23 Encounter for immunization: Secondary | ICD-10-CM

## 2022-06-23 MED ORDER — ESTRADIOL 0.05 MG/24HR TD PTTW: 1.0000 | MEDICATED_PATCH | TRANSDERMAL | 0 refills | Status: DC
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
## 2022-06-27 ENCOUNTER — Ambulatory Visit: Payer: BC Managed Care – PPO | Admitting: Audiologist

## 2022-09-01 ENCOUNTER — Telehealth (INDEPENDENT_AMBULATORY_CARE_PROVIDER_SITE_OTHER): Payer: BC Managed Care – PPO | Admitting: Nurse Practitioner

## 2022-09-01 ENCOUNTER — Encounter: Payer: Self-pay | Admitting: Nurse Practitioner

## 2022-09-01 VITALS — Ht 62.5 in | Wt 143.0 lb

## 2022-09-01 DIAGNOSIS — U071 COVID-19: Secondary | ICD-10-CM

## 2022-09-01 NOTE — Assessment & Plan Note (Signed)
COVID-positive.  Patient is not considered high risk although we did discuss antivirals are EUA only currently after joint discussion elected not to pursue antiviral at this juncture.  Did discuss to change her mind about the timeline and when to start.  Discussed CDC guidelines/recommendations in regards to self-isolation/quarantine.  Did discuss signs and symptoms when to seek urgent emergent healthcare.  Follow-up if no improvement.  Did discuss over-the-counter treatments for symptom management

## 2022-09-01 NOTE — Patient Instructions (Addendum)
You need to quarantine through 09/04/2022. IF you are fever free for 24 hours with out the use of fever reducing medication you can stop quarantining on 09/05/2022 but need to wear a mask through 09/09/2022.

## 2022-09-01 NOTE — Progress Notes (Signed)
Patient ID: Robin Henson, female    DOB: 09/24/1960, 62 y.o.   MRN: [id number]  Virtual visit completed through [employer], a video enabled telemedicine application. Due to national recommendations of social distancing due to COVID-19, a virtual visit is felt to be most appropriate for this patient at this time. Reviewed limitations, risks, security and privacy concerns of performing a virtual visit and the availability of in person appointments. I also reviewed that there may be a patient responsible charge related to this service. The patient agreed to proceed.   Patient location: home Provider location: [place] [facility], office Persons participating in this virtual visit: patient, provider   If any vitals were documented, they were collected by patient at home unless specified below.    Ht 5' 2.5" (1.588 m)   Wt 143 lb (64.9 kg)   BMI 25.74 kg/m    CC: Covid 19 Subjective:   HPI: Robin Henson is a 62 y.o. female presenting on 09/01/2022 for Covid Positive (yesterday), Generalized Body Aches (Started Tuesday), Nasal Congestion, and Cough (Rarely, mostly when lying down)    Symptoms started Tuesday 08/30/2022 Cpvod positive yesterday Covid vaccine: moderna x 3 Flu vaccine: utd No sick contacts States that she has been using ibuprofen, psuedofed. With some relief     Relevant past medical, surgical, family and social history reviewed and updated as indicated. Interim medical history since our last visit reviewed. Allergies and medications reviewed and updated. Outpatient Medications Prior to Visit  Medication Sig Dispense Refill   cholecalciferol (VITAMIN D3) 25 MCG (1000 UT) tablet Take 5,000 Units by mouth daily.     Cyanocobalamin (VITAMIN B-12 IJ) Inject as directed. Every 6 weeks     Cyanocobalamin (VITAMIN B12 PO) Take 1,000 mcg by mouth daily.      estradiol (VIVELLE-DOT) 0.05 MG/24HR patch Place 1 patch (0.05 mg total) onto the skin 2 (two) times a week. 24  patch 0   fluticasone (FLONASE) 50 MCG/ACT nasal spray PLACE 2 SPRAYS INTO BOTH NOSTRILS DAILY AS NEEDED FOR ALLERGIES OR RHINITIS. 48 mL 1   ibuprofen (ADVIL) 200 MG tablet Take 200 mg by mouth every 6 (six) hours as needed.     Probiotic Product (PROBIOTIC-10 PO) Take by mouth. 2 daily     No facility-administered medications prior to visit.     Per HPI unless specifically indicated in ROS section below Review of Systems  Constitutional:  Positive for appetite change, chills, fatigue and fever.       Fluid intake is good   HENT:  Positive for ear pain (feels clogged up), sneezing and sore throat. Negative for ear discharge.   Respiratory:  Positive for cough (when she lays down). Negative for shortness of breath.   Gastrointestinal:  Negative for abdominal pain, diarrhea, nausea and vomiting.  Musculoskeletal:  Positive for arthralgias and myalgias.  Neurological:  Positive for headaches.   Objective:  Ht 5' 2.5" (1.588 m)   Wt 143 lb (64.9 kg)   BMI 25.74 kg/m   Wt Readings from Last 3 Encounters:  09/01/22 143 lb (64.9 kg)  06/23/22 143 lb (64.9 kg)  06/15/22 142 lb 2 oz (64.5 kg)       Physical exam: Gen: alert, NAD, not ill appearing Pulm: speaks in complete sentences without increased work of breathing Psych: normal mood, normal thought content      Results for orders placed or performed in visit on 06/15/22  HM MAMMOGRAPHY  Result Value Ref Range  HM Mammogram 0-4 Bi-Rad 0-4 Bi-Rad, Self Reported Normal   Assessment & Plan:   COVID-19 Assessment & Plan: COVID-positive.  Patient is not considered high risk although we did discuss antivirals are EUA only currently after joint discussion elected not to pursue antiviral at this juncture.  Did discuss to change her mind about the timeline and when to start.  Discussed CDC guidelines/recommendations in regards to self-isolation/quarantine.  Did discuss signs and symptoms when to seek urgent emergent healthcare.   Follow-up if no improvement.  Did discuss over-the-counter treatments for symptom management      I discussed the assessment and treatment plan with the patient. The patient was provided an opportunity to ask questions and all were answered. The patient agreed with the plan and demonstrated an understanding of the instructions. The patient was advised to call back or seek an in-person evaluation if the symptoms worsen or if the condition fails to improve as anticipated.  Follow up plan: Return if symptoms worsen or fail to improve.  Romilda Garret, NP

## 2022-09-05 ENCOUNTER — Other Ambulatory Visit: Payer: Self-pay

## 2022-09-05 DIAGNOSIS — N951 Menopausal and female climacteric states: Secondary | ICD-10-CM

## 2022-09-05 MED ORDER — ESTRADIOL 0.05 MG/24HR TD PTTW: 1.0000 | MEDICATED_PATCH | TRANSDERMAL | 0 refills | Status: DC

## 2022-09-05 NOTE — Telephone Encounter (Signed)
Pt calling to request needing refills on estrogen patches. States was intitally scheduled for AEX on 09/23/2022. However, r/s by office due to [place] out and r/s for 10/04/2022. States only has 2 weeks of patches left.   Last mammo 04/22/2022-birads 0, Dx/US Rt Breast 04/28/2022-birads 3, f/u in 6 months.

## 2022-09-07 ENCOUNTER — Other Ambulatory Visit: Payer: Self-pay | Admitting: Family Medicine

## 2022-09-08 ENCOUNTER — Encounter: Payer: Self-pay | Admitting: Nurse Practitioner

## 2022-09-23 ENCOUNTER — Ambulatory Visit: Payer: BC Managed Care – PPO | Admitting: Radiology

## 2022-10-04 ENCOUNTER — Ambulatory Visit: Payer: BC Managed Care – PPO | Admitting: Radiology

## 2022-10-18 ENCOUNTER — Ambulatory Visit (INDEPENDENT_AMBULATORY_CARE_PROVIDER_SITE_OTHER): Payer: BC Managed Care – PPO | Admitting: Radiology

## 2022-10-18 ENCOUNTER — Encounter: Payer: Self-pay | Admitting: Radiology

## 2022-10-18 ENCOUNTER — Other Ambulatory Visit (HOSPITAL_COMMUNITY)
Admission: RE | Admit: 2022-10-18 | Discharge: 2022-10-18 | Disposition: A | Discharge status: Home / Self Care | Payer: BC Managed Care – PPO | Source: Ambulatory Visit | Attending: Radiology | Admitting: Radiology

## 2022-10-18 VITALS — BP 124/76 | Ht 62.5 in | Wt 149.0 lb

## 2022-10-18 DIAGNOSIS — M810 Age-related osteoporosis without current pathological fracture: Secondary | ICD-10-CM | POA: Diagnosis not present

## 2022-10-18 DIAGNOSIS — Z79890 Hormone replacement therapy (postmenopausal): Secondary | ICD-10-CM | POA: Diagnosis not present

## 2022-10-18 DIAGNOSIS — Z01419 Encounter for gynecological examination (general) (routine) without abnormal findings: Secondary | ICD-10-CM | POA: Diagnosis not present

## 2022-10-18 DIAGNOSIS — N951 Menopausal and female climacteric states: Secondary | ICD-10-CM

## 2022-10-18 MED ORDER — PROGESTERONE MICRONIZED 100 MG PO CAPS: 100.0000 mg | ORAL_CAPSULE | Freq: Every day | ORAL | 4 refills | Status: DC

## 2022-10-18 MED ORDER — ESTRADIOL 0.075 MG/24HR TD PTTW: 1.0000 | MEDICATED_PATCH | TRANSDERMAL | 4 refills | Status: DC

## 2022-10-18 NOTE — Progress Notes (Signed)
   Robin Henson [birth year] [id number]   History:  62 y.o. G2P2 here for AEX. Hot flashes have improved on estrogen patch but still having trouble sleeping and night sweats. Interested in [place]. Magnesium L-threonate causing bad dreams.     Health Maintenance Last Pap: 2012. Results were: normal Last mammogram: 04/28/22 Last colonoscopy: 2016. Results were: normal, 10 year recalls Last Dexa: 06/30/21 osteoporosis HRT [id number]:[id number]  Past medical history, past surgical history, family history and social history were all reviewed and documented in the EPIC chart.  ROS:  A ROS was performed and pertinent positives and negatives are included.  Exam:  Vitals:   10/18/22 1339  BP: 124/76  Weight: 149 lb (67.6 kg)  Height: 5' 2.5" (1.588 m)   Body mass index is 26.82 kg/m.  General appearance:  Normal Thyroid:  Symmetrical, normal in size, without palpable masses or nodularity. Respiratory  Auscultation:  Clear without wheezing or rhonchi Cardiovascular  Auscultation:  Regular rate, without rubs, murmurs or gallops  Edema/varicosities:  Not grossly evident Abdominal  Soft,nontender, without masses, guarding or rebound.  Liver/spleen:  No organomegaly noted  Hernia:  None appreciated  Skin  Inspection:  Grossly normal Breasts: Examined lying and sitting.   Right: Without masses, retractions, nipple discharge or axillary adenopathy.   Left: Without masses, retractions, nipple discharge or axillary adenopathy. Genitourinary   Inguinal/mons:  Normal without inguinal adenopathy  External genitalia:  Normal appearing vulva with no masses, tenderness, or lesions  BUS/Urethra/Skene's glands:  Normal  Vagina:  Normal appearing with normal color and discharge, no lesions. Atrophy mild  Cervix:  absent  Uterus:  absent  Adnexa/parametria:     Rt: Normal in size, without masses or tenderness.   Lt: Normal in size, without masses or tenderness.  Anus and  perineum: Normal    Patient informed chaperone available to be present for breast and pelvic exam. Patient has requested no chaperone to be present. Patient has been advised what will be completed during breast and pelvic exam.   Assessment/Plan:   1. Well woman exam with routine gynecological exam  - Cytology - PAP( [place])  2. Menopausal vasomotor syndrome Increase patch dose to help with night sweats May switch to magnesium blend to help with sleep -estradiol (VIVELLE-DOT) 0.075 MG/24HR; Place 1 patch onto the skin 2 (two) times a week.  Dispense: 24 patch; Refill: 4  3. Hormone replacement therapy (HRT) Will add progesterone to help with sleep - progesterone (PROMETRIUM) 100 MG capsule; Take 1 capsule (100 mg total) by mouth daily.  Dispense: 90 capsule; Refill: 4  4. Osteoporosis without current pathological fracture, unspecified osteoporosis type Discussed the benefits of estrogen strengthening bone     Discussed SBE, colonoscopy and DEXA screening as appropriate. Encouraged 131mins/week of cardiovascular and weight bearing exercise minimum. Recommend the use of seatbelts and sunscreen consistently.   Return in 1 year for annual or sooner prn.  Rubbie Battiest B WHNP-BC 2:28 PM 10/18/2022

## 2022-10-20 LAB — CYTOLOGY - PAP: Diagnosis: NEGATIVE

## 2022-11-07 HISTORY — PX: BREAST BIOPSY: SHX20

## 2022-11-21 ENCOUNTER — Telehealth: Payer: Self-pay

## 2022-11-21 NOTE — Telephone Encounter (Signed)
Cordelia Pen nurse navigator with breast clinic of [facility] mammography called report for Dr Milinda Antis; Breast biopsy benign and concorded. Request Sept 2024 bilateral diagnostic mammogram to be done. Sherry placed order in pts chart to be signed. Sending note to Dr Milinda Antis and [employer] and will notify [place] CMA.

## 2022-12-02 NOTE — Telephone Encounter (Signed)
The glycinate will  not help with sleep or inflammation, just anxiety. I would recommend the magnesium blend or follow up with PCP for further workup/management of poor sleep patterns.

## 2022-12-02 NOTE — Telephone Encounter (Signed)
100mg  of progesterone is the standard dose, she can continue to use that

## 2022-12-02 NOTE — Telephone Encounter (Signed)
Life extension Magnesium Caps 500mg  has three different kinds of magnesium: magnesium oxide, magnesium citrate and magnesium succinate which she may see better benefit from just taking 1-2 caps nightly. For vitamin D 5000iu should be enough with of K2

## 2022-12-05 ENCOUNTER — Ambulatory Visit: Payer: BC Managed Care – PPO | Admitting: Podiatry

## 2022-12-05 ENCOUNTER — Encounter: Payer: Self-pay | Admitting: Podiatry

## 2022-12-05 DIAGNOSIS — M722 Plantar fascial fibromatosis: Secondary | ICD-10-CM

## 2022-12-06 ENCOUNTER — Encounter: Payer: Self-pay | Admitting: Family Medicine

## 2022-12-06 ENCOUNTER — Telehealth: Payer: Self-pay | Admitting: Family Medicine

## 2022-12-06 ENCOUNTER — Ambulatory Visit: Payer: BC Managed Care – PPO | Admitting: Family Medicine

## 2022-12-06 ENCOUNTER — Ambulatory Visit (INDEPENDENT_AMBULATORY_CARE_PROVIDER_SITE_OTHER)
Admission: RE | Admit: 2022-12-06 | Discharge: 2022-12-06 | Disposition: A | Discharge status: Home / Self Care | Payer: BC Managed Care – PPO | Source: Ambulatory Visit | Attending: Family Medicine | Admitting: Family Medicine

## 2022-12-06 VITALS — BP 120/72 | HR 70 | Temp 97.6°F | Ht 62.5 in | Wt 151.0 lb

## 2022-12-06 DIAGNOSIS — M25541 Pain in joints of right hand: Secondary | ICD-10-CM

## 2022-12-06 DIAGNOSIS — M2550 Pain in unspecified joint: Secondary | ICD-10-CM

## 2022-12-06 DIAGNOSIS — E538 Deficiency of other specified B group vitamins: Secondary | ICD-10-CM | POA: Diagnosis not present

## 2022-12-06 NOTE — Progress Notes (Signed)
Subjective:   Patient ID: Robin Henson, female   DOB: 62 y.o.   MRN: [id number]   HPI Patient states she needs new orthotics as her other pair is starting to wear and she was getting some reoccurrence of symptoms   ROS      Objective:  Physical Exam  Neurovascular status intact mild discomfort within the plantar fascia controllable with orthotics good shoe gear choices stretching     Assessment:  Low-grade Planter fasciitis that is controlled with conservative treatment and orthotics     Plan:  Reviewed the old orthotics and we will consider long-term having them recovered and casted scan for new orthotics with all instructions and we will get those back to her soon as possible

## 2022-12-06 NOTE — Telephone Encounter (Signed)
Per appt notes pt already has appt scheduled with Dr Para March 12/06/22 at 3 PM. Sending note to Dr Para March and Para March pool.   [facility] Day - Client TELEPHONE ADVICE RECORD [facility] Patient Name: Robin Henson MERS Gender: Female DOB: 1961-07-12 Age: 62 Y 8 M 9 D Return Phone Number: [phone] (Primary), [phone] (Secondary) Address: City/ State/ Zip: [place] [address] Client [facility] Day - Client Client Site [facility] - Day Provider Milinda Antis, Idamae Schuller - MD Contact Type Call Who Is Calling Patient / Member / Family / Caregiver Call Type Triage / Clinical Relationship To Patient Self Return Phone Number [phone] (Primary) Chief Complaint NUMBNESS/TINGLING- sudden on one side of the body or face Reason for Call Symptomatic / Request for Health Information Initial Comment Caller states she is having swelling in her hands and tingling in her hands. She is having a salt taste in her mouth. Translation No Nurse Assessment Nurse: Elesa Hacker, RN, Nash Dimmer Date/Time Lamount Cohen Time): 12/06/2022 9:17:16 AM Confirm and document reason for call. If symptomatic, describe symptoms. ---Caller states she is having swelling in her hands and tingling in her hands. She is having a salt taste in her mouth. Sx started last Wednesday. States that it is both hands. All joints affected. Rings are stuck on finger. Does the patient have any new or worsening symptoms? ---Yes Will a triage be completed? ---Yes Related visit to physician within the last 2 weeks? ---No Does the PT have any chronic conditions? (i.e. diabetes, asthma, this includes High risk factors for pregnancy, etc.) ---Yes List chronic conditions. ---Low B 12 Low vitamin D Is this a behavioral health or substance abuse call? ---No Guidelines Guideline Title Affirmed Question Affirmed Notes Nurse Date/Time Lamount Cohen Time) Neurologic Deficit Neck pain  (and neurologic deficit) Deaton, RN, Nash Dimmer 12/06/2022 9:20:04 AM Disp. Time Lamount Cohen Time) Disposition Final User 12/06/2022 9:15:24 AM Send to Urgent Queue Annabell Sabal 12/06/2022 9:23:15 AM See HCP within 4 Hours (or PCP triage) Yes Deaton, RN, Nash Dimmer PLEASE NOTE: All timestamps contained within this report are represented as [place] Standard Time. CONFIDENTIALTY NOTICE: This fax transmission is intended only for the addressee. It contains information that is legally privileged, confidential or otherwise protected from use or disclosure. If you are not the intended recipient, you are strictly prohibited from reviewing, disclosing, copying using or disseminating any of this information or taking any action in reliance on or regarding this information. If you have received this fax in error, please notify us immediately by telephone so that we can arrange for its return to [place]. Phone: [phone], Toll-Free: [phone], Fax: [phone] Page: 2 of 2 Call Id: [id number] Final Disposition 12/06/2022 9:23:15 AM See HCP within 4 Hours (or PCP triage) Yes Deaton, RN, Cory Roughen Disagree/Comply Disagree Caller Understands Yes PreDisposition Did not know what to do Care Advice Given Per Guideline SEE HCP (OR PCP TRIAGE) WITHIN 4 HOURS: * IF OFFICE WILL BE OPEN: You need to be seen within the next 3 or 4 hours. Call your doctor (or NP/PA) now or as soon as the office opens. CALL BACK IF: * You become worse CARE ADVICE given per Neurologic Deficit (Adult) guideline. * OFFICE: If patient sounds stable and not seriously ill, consult PCP (or follow your office policy) to see if patient can be seen NOW in office. Comments User: [name], RN Date/Time Lamount Cohen Time): 12/06/2022 9:25:56 AM Caller advised that she has an appt for 3 pm today and that  she is going to keep that, states that she did not want to try and get an earlier appt. Caller advised that sx started last wednesday and that  her ring is stuck on her hand, states that at times in the day she can move it. Caller states that she also has neck pain and back pain normally . Caller had 4 hour outcome, did not want to get an earlier appt. States that she has been having numbness and tingling in both hands since last week. User: [name], RN Date/Time Lamount Cohen Time): 12/06/2022 9:33:09 AM Spoke with Robin Henson at the office. Noted. Referrals REFERRED TO PCP OFFIC

## 2022-12-06 NOTE — Progress Notes (Unsigned)
Patient is having swelling in the finger joints B. Going on for about 1 week.  Tingling in the hands, more recently, intermittent.  No hand swelling.  No joint redness or heat.  She is having a salty taste in her mouth. No loss of smell.  No FCNAVD.  Some fatigue at baseline.  No new medicines except for a probiotic.  She stopped that in the meantime but stopping didn't help. No recent B12 shot.  No foot swelling.  No tingling in the feet.    Ring cautions d/w pt, about removal.  She can rotate them and doesn't have vascular compromise.    No GERD sx.    Meds, vitals, and allergies reviewed.   ROS: Per HPI unless specifically indicated in ROS section   Chronic mild IP joint changes.   No erythema  Normal sensation and cap refill.  OP wnl Rrr Ctab.

## 2022-12-06 NOTE — Patient Instructions (Signed)
Go to the lab on the way out.   If you have mychart we'll likely use that to update you.    Don't change your meds for now.  Take care.  Glad to see you. 

## 2022-12-06 NOTE — Telephone Encounter (Signed)
Will see at OV 

## 2022-12-06 NOTE — Telephone Encounter (Signed)
FYI: This call has been transferred to Access Nurse. Once the result note has been entered staff can address the message at that time.  Patient called in with the following symptoms:  Red Word: swollen joint/tingling in hands and legs feeling restless.   Please advise at Mobile [phone] (mobile)  Message is routed to Provider Pool and [facility] Triage

## 2022-12-07 DIAGNOSIS — M2550 Pain in unspecified joint: Secondary | ICD-10-CM | POA: Insufficient documentation

## 2022-12-07 LAB — COMPREHENSIVE METABOLIC PANEL
ALT: 16 IU/L (ref 0–32)
AST: 19 IU/L (ref 0–40)
Albumin/Globulin Ratio: 2.1 (ref 1.2–2.2)
Albumin: 4.4 g/dL (ref 3.8–4.9)
Alkaline Phosphatase: 45 IU/L (ref 44–121)
BUN/Creatinine Ratio: 23 (ref 9–23)
BUN: 14 mg/dL (ref 6–24)
Bilirubin Total: 0.3 mg/dL (ref 0.0–1.2)
CO2: 24 mmol/L (ref 20–29)
Calcium: 9.6 mg/dL (ref 8.7–10.2)
Chloride: 100 mmol/L (ref 96–106)
Creatinine, Ser: 0.61 mg/dL (ref 0.57–1.00)
Globulin, Total: 2.1 g/dL (ref 1.5–4.5)
Glucose: 89 mg/dL (ref 70–99)
Potassium: 4 mmol/L (ref 3.5–5.2)
Sodium: 139 mmol/L (ref 134–144)
Total Protein: 6.5 g/dL (ref 6.0–8.5)
eGFR: 103 mL/min/{1.73_m2} (ref >59)

## 2022-12-07 LAB — CBC WITH DIFFERENTIAL/PLATELET
Basophils Absolute: 0 10*3/uL (ref 0.0–0.2)
Basos: 1 %
EOS (ABSOLUTE): 0.1 10*3/uL (ref 0.0–0.4)
Eos: 1 %
Hematocrit: 41.1 % (ref 34.0–46.6)
Hemoglobin: 13.3 g/dL (ref 11.1–15.9)
Immature Grans (Abs): 0 10*3/uL (ref 0.0–0.1)
Immature Granulocytes: 0 %
Lymphocytes Absolute: 1.9 10*3/uL (ref 0.7–3.1)
Lymphs: 33 %
MCH: 30.3 pg (ref 26.6–33.0)
MCHC: 32.4 g/dL (ref 31.5–35.7)
MCV: 94 fL (ref 79–97)
Monocytes Absolute: 0.4 10*3/uL (ref 0.1–0.9)
Monocytes: 8 %
Neutrophils Absolute: 3.3 10*3/uL (ref 1.4–7.0)
Neutrophils: 57 %
Platelets: 212 10*3/uL (ref 150–450)
RBC: 4.39 x10E6/uL (ref 3.77–5.28)
RDW: 13.1 % (ref 11.7–15.4)
WBC: 5.7 10*3/uL (ref 3.4–10.8)

## 2022-12-07 LAB — SEDIMENTATION RATE: Sed Rate: 6 mm/hr (ref 0–40)

## 2022-12-07 LAB — VITAMIN B12: Vitamin B-12: 755 pg/mL (ref 232–1245)

## 2022-12-07 NOTE — Assessment & Plan Note (Signed)
This could just be an osteoarthritis flare that is separate from her other symptoms.  See notes on imaging.  See notes on labs.

## 2022-12-07 NOTE — Assessment & Plan Note (Signed)
Unclear if this contributes to the symptoms she is having in her mouth of the tingling in her hands.  See notes on labs.

## 2023-01-26 ENCOUNTER — Encounter: Payer: Self-pay | Admitting: Family Medicine

## 2023-01-26 ENCOUNTER — Ambulatory Visit: Payer: BC Managed Care – PPO | Admitting: Family Medicine

## 2023-01-26 VITALS — BP 138/68 | HR 66 | Temp 97.8°F | Ht 62.5 in | Wt 150.4 lb

## 2023-01-26 DIAGNOSIS — R300 Dysuria: Secondary | ICD-10-CM

## 2023-01-26 DIAGNOSIS — R635 Abnormal weight gain: Secondary | ICD-10-CM | POA: Diagnosis not present

## 2023-01-26 DIAGNOSIS — N898 Other specified noninflammatory disorders of vagina: Secondary | ICD-10-CM | POA: Insufficient documentation

## 2023-01-26 LAB — POCT UA - MICROSCOPIC ONLY
Bacteria, U Microscopic: 0
RBC, Urine, Miroscopic: 0 (ref 0–2)

## 2023-01-26 LAB — POC URINALSYSI DIPSTICK (AUTOMATED)
Bilirubin, UA: NEGATIVE
Blood, UA: NEGATIVE
Glucose, UA: NEGATIVE
Ketones, UA: NEGATIVE
Leukocytes, UA: NEGATIVE
Nitrite, UA: NEGATIVE
Protein, UA: NEGATIVE
Spec Grav, UA: 1.025 (ref 1.010–1.025)
Urobilinogen, UA: 0.2 E.U./dL
pH, UA: 6 (ref 5.0–8.0)

## 2023-01-26 LAB — POCT WET PREP (WET MOUNT)
Clue Cells Wet Prep Whiff POC: NEGATIVE
Trichomonas Wet Prep HPF POC: ABSENT

## 2023-01-26 MED ORDER — METRONIDAZOLE 500 MG PO TABS: 500.0000 mg | ORAL_TABLET | Freq: Two times a day (BID) | ORAL | 0 refills | Status: AC

## 2023-01-26 NOTE — Assessment & Plan Note (Signed)
Since going to the beach/not swimming  No improvement with diflucan (old pill)  Reassuring exam Ua and micro clear Wet prep shows mod clue cells  Suspect BV Discussed vaginal atrophy / also on HRT now Treat with oral flagyl  Update if not starting to improve in a week or if worsening  Declines STD screen/low risk

## 2023-01-26 NOTE — Telephone Encounter (Signed)
Will route to provider and close.  
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
## 2023-01-26 NOTE — Assessment & Plan Note (Signed)
Pt is frustrated with menopausal weight gain  Has worked with nurtitionist  Discussed goal of increase muscle mass to increase met rate Reviewed options for strength training  Continue low glycemic eating

## 2023-01-26 NOTE — Patient Instructions (Addendum)
Add some strength training to your routine, this is important for bone and brain health and can reduce your risk of falls and help your body use insulin properly and regulate weight  Light weights, exercise bands , and internet videos are a good way to start  Yoga (chair or regular), machines , floor exercises or a gym with machines are also good options    I think you have BV (bacterial vaginosis) Take generic flagyl as directed for 7 days   A probiotic is ok   Urine is clear   Update if not starting to improve in a week or if worsening

## 2023-02-02 ENCOUNTER — Ambulatory Visit (INDEPENDENT_AMBULATORY_CARE_PROVIDER_SITE_OTHER): Payer: BC Managed Care – PPO | Admitting: Podiatry

## 2023-02-02 DIAGNOSIS — M722 Plantar fascial fibromatosis: Secondary | ICD-10-CM

## 2023-02-02 NOTE — Progress Notes (Signed)
Patient presents today to pick up custom orthotics   Patient was dispensed 1 pair of custom orthotics  Fit was satisfactory. Instructions for break-in and wear was reviewed and a copy was given to the patient.    

## 2023-02-07 NOTE — Telephone Encounter (Signed)
Correct

## 2023-02-07 NOTE — Telephone Encounter (Signed)
Those labs would be done through her PCP.

## 2023-02-08 NOTE — Telephone Encounter (Signed)
Pt scheduled on 03/14/2023. Will route to provider for final review and close.

## 2023-02-13 ENCOUNTER — Ambulatory Visit: Payer: BC Managed Care – PPO | Admitting: Radiology

## 2023-02-13 ENCOUNTER — Encounter: Payer: Self-pay | Admitting: Radiology

## 2023-02-13 VITALS — BP 118/76 | HR 63 | Wt 151.0 lb

## 2023-02-13 DIAGNOSIS — N958 Other specified menopausal and perimenopausal disorders: Secondary | ICD-10-CM | POA: Diagnosis not present

## 2023-02-13 DIAGNOSIS — N761 Subacute and chronic vaginitis: Secondary | ICD-10-CM | POA: Diagnosis not present

## 2023-02-13 LAB — WET PREP FOR TRICH, YEAST, CLUE

## 2023-02-13 MED ORDER — FLUCONAZOLE 150 MG PO TABS
150.0000 mg | ORAL_TABLET | ORAL | 0 refills | Status: DC
Start: 2023-02-13 — End: 2023-03-14

## 2023-02-13 MED ORDER — IMVEXXY MAINTENANCE PACK 10 MCG VA INST
1.0000 | VAGINAL_INSERT | VAGINAL | 6 refills | Status: DC
Start: 2023-02-13 — End: 2023-07-12

## 2023-02-13 NOTE — Progress Notes (Signed)
      Subjective: Robin Henson is a 62 y.o. female who complains of having some itching and burning. Her primary treated her for bv on 6/20 and she took 7 days of antibiotic. She says that on Saturday the itching and burning started again.    Review of Systems  All other systems reviewed and are negative.   Past Medical History:  Diagnosis Date   Calculus in urethra    Degeneration of lumbar or lumbosacral intervertebral disc    Diffuse cystic mastopathy    Dyspepsia and other specified disorders of function of stomach    Enthesopathy of hip region    Fibrocystic breast disease    multiple aspirations   GERD (gastroesophageal reflux disease)    EGD negative 11/04   Helicobacter pylori (H. pylori)    history    IC (interstitial cystitis)    Left ovarian cyst    x 2- pelvic ultrasound 11/2006   Lump or mass in breast    OA (osteoarthritis)    Oral aphthae    Seasonal allergies    Solitary cyst of breast    Urinary incontinence       Objective:  Today's Vitals   02/13/23 1400  BP: 118/76  Pulse: 63  SpO2: 100%  Weight: 151 lb (68.5 kg)   Body mass index is 27.18 kg/m.   Physical Exam Exam conducted with a chaperone present.  Constitutional:      Appearance: Normal appearance. She is normal weight.  Pulmonary:     Effort: Pulmonary effort is normal.  Genitourinary:    General: Normal vulva.     Vagina: Vaginal discharge and erythema present. No tenderness, bleeding or lesions.     Uterus: Absent.   Neurological:     Mental Status: She is alert.  Psychiatric:        Mood and Affect: Mood normal.        Thought Content: Thought content normal.        Judgment: Judgment normal.      Microscopic wet-mount exam shows hyphae.   Carolynn Serve, CMA present for exam  Assessment:/Plan:  1. Subacute vaginitis - WET PREP FOR TRICH, YEAST, CLUE - fluconazole (DIFLUCAN) 150 MG tablet; Take 1 tablet (150 mg total) by mouth every 3 (three) days.  Dispense: 2  tablet; Refill: 0  2. Genitourinary syndrome of menopause - Estradiol (IMVEXXY MAINTENANCE PACK) 10 MCG INST; Place 1 tablet vaginally 2 (two) times a week.  Dispense: 8 each; Refill: 6   Avoid intercourse until symptoms are resolved. Safe sex encouraged. Avoid the use of soaps or perfumed products in the peri area. Avoid tub baths and sitting in sweaty or wet clothing for prolonged periods of time.

## 2023-02-13 NOTE — Telephone Encounter (Signed)
Will need repeat wet prep. Either place for a visit is fine.

## 2023-02-13 NOTE — Telephone Encounter (Signed)
Saw pcp on 01/26/2023. +Clue cells. Tx w/ flagyl. Please advise.

## 2023-02-27 NOTE — Telephone Encounter (Signed)
May go back to the twice a week schedule. Whichever days work best for her.

## 2023-03-14 ENCOUNTER — Ambulatory Visit: Payer: BC Managed Care – PPO | Admitting: Radiology

## 2023-03-14 VITALS — BP 104/66

## 2023-03-14 DIAGNOSIS — Z79890 Hormone replacement therapy (postmenopausal): Secondary | ICD-10-CM

## 2023-03-14 DIAGNOSIS — N958 Other specified menopausal and perimenopausal disorders: Secondary | ICD-10-CM

## 2023-03-14 DIAGNOSIS — E569 Vitamin deficiency, unspecified: Secondary | ICD-10-CM

## 2023-03-14 DIAGNOSIS — R5382 Chronic fatigue, unspecified: Secondary | ICD-10-CM | POA: Diagnosis not present

## 2023-03-14 DIAGNOSIS — B3731 Acute candidiasis of vulva and vagina: Secondary | ICD-10-CM | POA: Diagnosis not present

## 2023-03-14 DIAGNOSIS — N952 Postmenopausal atrophic vaginitis: Secondary | ICD-10-CM | POA: Diagnosis not present

## 2023-03-14 LAB — WET PREP FOR TRICH, YEAST, CLUE

## 2023-03-14 MED ORDER — FLUCONAZOLE 150 MG PO TABS: 150.0000 mg | ORAL_TABLET | ORAL | 0 refills | Status: DC

## 2023-03-14 NOTE — Progress Notes (Signed)
      Subjective: Robin Henson is a 62 y.o. female who complains of having some vaginal itching. She was treated for BV by PCP 10 weeks ago, then treated for yeast by me 4 weeks ago. Symptoms have improved but not fully. On HRT, still complains of fatigue, low libido and weight gain.  Review of Systems  All other systems reviewed and are negative.   Past Medical History:  Diagnosis Date   Calculus in urethra    Degeneration of lumbar or lumbosacral intervertebral disc    Diffuse cystic mastopathy    Dyspepsia and other specified disorders of function of stomach    Enthesopathy of hip region    Fibrocystic breast disease    multiple aspirations   GERD (gastroesophageal reflux disease)    EGD negative 11/04   Helicobacter pylori (H. pylori)    history    IC (interstitial cystitis)    Left ovarian cyst    x 2- pelvic ultrasound 11/2006   Lump or mass in breast    OA (osteoarthritis)    Oral aphthae    Seasonal allergies    Solitary cyst of breast    Urinary incontinence       Objective:  Today's Vitals   03/14/23 1059  BP: 104/66   There is no height or weight on file to calculate BMI.   Physical Exam Exam conducted with a chaperone present.  Constitutional:      Appearance: Normal appearance. She is normal weight.  Pulmonary:     Effort: Pulmonary effort is normal.  Genitourinary:    General: Normal vulva.     Vagina: Vaginal discharge and erythema present. No tenderness, bleeding or lesions.     Uterus: Absent.   Neurological:     Mental Status: She is alert.  Psychiatric:        Mood and Affect: Mood normal.        Thought Content: Thought content normal.        Judgment: Judgment normal.     Microscopic wet-mount exam shows hyphae.   Raynelle Fanning, CMA present for exam  Assessment:/Plan:  1. Atrophic vaginitis - WET PREP FOR TRICH, YEAST, CLUE  2. Chronic fatigue - Cortisol - Thyroid Panel With TSH - Estradiol - Testosterone, Free, Total, SHBG -  Vitamin D (25 hydroxy) - Vitamin B12  3. Vitamin deficiency - Vitamin D (25 hydroxy) - Vitamin B12  4. Hormone replacement therapy (HRT) - Estradiol  5. Yeast vaginitis - fluconazole (DIFLUCAN) 150 MG tablet; Take 1 tablet (150 mg total) by mouth every 3 (three) days.  Dispense: 2 tablet; Refill: 0  6. Genitourinary syndrome of menopause Continue imvexxy, tissue is already improving   Avoid intercourse until symptoms are resolved. Safe sex encouraged. Avoid the use of soaps or perfumed products in the peri area. Avoid tub baths and sitting in sweaty or wet clothing for prolonged periods of time.

## 2023-03-16 NOTE — Progress Notes (Signed)
She may need to increase selenium in her diet, it is not low enough to be concerning.

## 2023-03-22 ENCOUNTER — Other Ambulatory Visit: Payer: Self-pay | Admitting: General Surgery

## 2023-03-22 DIAGNOSIS — N63 Unspecified lump in unspecified breast: Secondary | ICD-10-CM

## 2023-03-24 ENCOUNTER — Inpatient Hospital Stay
Admission: RE | Admit: 2023-03-24 | Discharge: 2023-03-24 | Disposition: A | Discharge status: Home / Self Care | Payer: Self-pay | Source: Ambulatory Visit | Attending: Family Medicine | Admitting: Family Medicine

## 2023-03-24 ENCOUNTER — Other Ambulatory Visit: Payer: Self-pay | Admitting: *Deleted

## 2023-03-24 DIAGNOSIS — Z1231 Encounter for screening mammogram for malignant neoplasm of breast: Secondary | ICD-10-CM

## 2023-04-03 ENCOUNTER — Other Ambulatory Visit: Payer: Self-pay | Admitting: Nurse Practitioner

## 2023-04-03 DIAGNOSIS — B379 Candidiasis, unspecified: Secondary | ICD-10-CM

## 2023-04-03 DIAGNOSIS — B3731 Acute candidiasis of vulva and vagina: Secondary | ICD-10-CM

## 2023-04-03 DIAGNOSIS — N761 Subacute and chronic vaginitis: Secondary | ICD-10-CM

## 2023-04-03 MED ORDER — TERCONAZOLE 0.4 % VA CREA
1.0000 | TOPICAL_CREAM | Freq: Every day | VAGINAL | 0 refills | Status: AC
Start: 2023-04-03 — End: 2023-04-10

## 2023-04-03 NOTE — Telephone Encounter (Signed)
I would recommend stopping vaginal estrogen and treating with Terazol cream. Not sure if the infection is persistent or recurrent. So I say we try different treatment. Restart vaginal estrogen a couple days after treating yeast.

## 2023-04-03 NOTE — Telephone Encounter (Signed)
Prescription has been sent. Thanks

## 2023-04-06 ENCOUNTER — Encounter: Payer: Self-pay | Admitting: Nurse Practitioner

## 2023-04-06 ENCOUNTER — Ambulatory Visit: Payer: BC Managed Care – PPO | Admitting: Nurse Practitioner

## 2023-04-06 VITALS — BP 100/78 | HR 64 | Temp 98.4°F | Ht 62.5 in | Wt 149.8 lb

## 2023-04-06 DIAGNOSIS — H60391 Other infective otitis externa, right ear: Secondary | ICD-10-CM

## 2023-04-06 DIAGNOSIS — H6020 Malignant otitis externa, unspecified ear: Secondary | ICD-10-CM | POA: Insufficient documentation

## 2023-04-06 DIAGNOSIS — H6993 Unspecified Eustachian tube disorder, bilateral: Secondary | ICD-10-CM | POA: Diagnosis not present

## 2023-04-06 MED ORDER — NEOMYCIN-POLYMYXIN-HC 3.5-10000-1 OT SOLN: 4.0000 [drp] | Freq: Four times a day (QID) | OTIC | 0 refills | Status: DC

## 2023-04-06 NOTE — Assessment & Plan Note (Signed)
Patient has Flonase at home encourage patient to use of 7 to 10 days.

## 2023-04-06 NOTE — Progress Notes (Signed)
Acute Office Visit  Subjective:     Patient ID: Robin Henson, female    DOB: 02/10/1961, 62 y.o.   MRN: [id number]  Chief Complaint  Patient presents with   Ear Pain    Pt complains of left ear pain started yesterday and right ear pain starting today. Pt states she has slight nasal drainage. Complains of pain when water enters ear.     HPI Patient is in today for ear pain with a history of GERD, osteoporosis    Left and right ear is bothering her. States that it started yesterday. States at one time it was draining and swollen. States that she woke up this morning. States it does hurt. Ibuprofen did not help. States that when she did shower and it idid hurt when water got in it.  No recent swimming or head being submerged   Review of Systems  Constitutional:  Negative for chills and fever.  HENT:  Positive for ear discharge, ear pain and sore throat. Negative for sinus pain.   Respiratory:  Negative for cough.   Cardiovascular:  Negative for chest pain.  Musculoskeletal:  Negative for myalgias.  Neurological:  Negative for headaches.        Objective:    BP 100/78   Pulse 64   Temp 98.4 F (36.9 C) (Temporal)   Ht 5' 2.5" (1.588 m)   Wt 149 lb 12.8 oz (67.9 kg)   SpO2 97%   BMI 26.96 kg/m    Physical Exam Vitals and nursing note reviewed.  Constitutional:      Appearance: Normal appearance.  HENT:     Right Ear: Tympanic membrane, ear canal and external ear normal.     Left Ear: Tympanic membrane, ear canal and external ear normal.     Ears:     Comments: Tenderness to the left canal with palpation of tragus or pulling on the auricle    Mouth/Throat:     Mouth: Mucous membranes are moist.     Pharynx: Oropharynx is clear.  Cardiovascular:     Rate and Rhythm: Normal rate and regular rhythm.     Heart sounds: Normal heart sounds.  Pulmonary:     Effort: Pulmonary effort is normal.     Breath sounds: Normal breath sounds.  Lymphadenopathy:      Cervical: No cervical adenopathy.  Neurological:     Mental Status: She is alert.     No results found for any visits on 04/06/23.      Assessment & Plan:   Problem List Items Addressed This Visit       Nervous and Auditory   Acute malignant otitis externa - Primary    Will treat for infective otitis externa with Cortisporin.  Signs and symptoms reviewed when to be seen urgent or emergently over the weekend.  Follow-up if no improvement      Eustachian tube dysfunction, bilateral    Patient has Flonase at home encourage patient to use of 7 to 10 days.       Meds ordered this encounter  Medications   neomycin-polymyxin-hydrocortisone (CORTISPORIN) OTIC solution    Sig: Place 4 drops into the right ear 4 (four) times daily.    Dispense:  10 mL    Refill:  0    Order Specific Question:   Supervising Provider    Answer:   TOWER, MARNE A [[id number]    Return if symptoms worsen or fail to improve.  Audria Nine, NP

## 2023-04-06 NOTE — Patient Instructions (Signed)
Nice to see you today I have sent a drop to use in the ear for the next 7 days Follow up if you do not improve I would start back on the flonase

## 2023-04-06 NOTE — Assessment & Plan Note (Signed)
Will treat for infective otitis externa with Cortisporin.  Signs and symptoms reviewed when to be seen urgent or emergently over the weekend.  Follow-up if no improvement

## 2023-04-13 MED ORDER — FLUCONAZOLE 150 MG PO TABS
ORAL_TABLET | ORAL | 0 refills | Status: DC
Start: 2023-04-13 — End: 2023-04-26

## 2023-04-13 NOTE — Telephone Encounter (Signed)
Ok to send imvexxy twice weekly [facility]

## 2023-04-13 NOTE — Telephone Encounter (Signed)
Pt agreeable with plan. Rx sent. Routing to provider for final review.

## 2023-04-13 NOTE — Addendum Note (Signed)
Addended by: Jodelle Red D on: 04/13/2023 02:05 PM   Modules accepted: Orders

## 2023-04-13 NOTE — Telephone Encounter (Signed)
I think there was some confusion regarding not being able to easily use the cream with her hand- She was talking about Terazol? If she has not been able to finish that I would recommend fluconazol 150mg  po every 3 days x 3 doses. I would restart using the imvexxy twice a week.

## 2023-04-21 NOTE — Telephone Encounter (Signed)
Call placed to patient to schedule OV. Left message advised office phones go off at 1615,  return call to office on Monday after 8am, to schedule OV with Jami for further evaluation,  [phone], opt 1.

## 2023-04-24 ENCOUNTER — Other Ambulatory Visit: Payer: Self-pay | Admitting: Family Medicine

## 2023-04-25 ENCOUNTER — Other Ambulatory Visit: Payer: Self-pay | Admitting: General Surgery

## 2023-04-25 ENCOUNTER — Ambulatory Visit
Admission: RE | Admit: 2023-04-25 | Discharge: 2023-04-25 | Disposition: A | Discharge status: Home / Self Care | Payer: BC Managed Care – PPO | Source: Ambulatory Visit | Attending: General Surgery

## 2023-04-25 ENCOUNTER — Ambulatory Visit
Admission: RE | Admit: 2023-04-25 | Discharge: 2023-04-25 | Disposition: A | Discharge status: Home / Self Care | Payer: BC Managed Care – PPO | Source: Ambulatory Visit | Attending: General Surgery | Admitting: General Surgery

## 2023-04-25 DIAGNOSIS — R928 Other abnormal and inconclusive findings on diagnostic imaging of breast: Secondary | ICD-10-CM | POA: Diagnosis present

## 2023-04-25 DIAGNOSIS — N63 Unspecified lump in unspecified breast: Secondary | ICD-10-CM

## 2023-04-26 ENCOUNTER — Ambulatory Visit: Payer: BC Managed Care – PPO | Admitting: Radiology

## 2023-04-26 VITALS — BP 112/70

## 2023-04-26 DIAGNOSIS — R32 Unspecified urinary incontinence: Secondary | ICD-10-CM

## 2023-04-26 DIAGNOSIS — Z79890 Hormone replacement therapy (postmenopausal): Secondary | ICD-10-CM

## 2023-04-26 DIAGNOSIS — N760 Acute vaginitis: Secondary | ICD-10-CM

## 2023-04-26 LAB — URINALYSIS, COMPLETE W/RFL CULTURE
Bacteria, UA: NONE SEEN /HPF
Bilirubin Urine: NEGATIVE
Glucose, UA: NEGATIVE
Hgb urine dipstick: NEGATIVE
Hyaline Cast: NONE SEEN /LPF
Ketones, ur: NEGATIVE
Leukocyte Esterase: NEGATIVE
Nitrites, Initial: NEGATIVE
Protein, ur: NEGATIVE
RBC / HPF: NONE SEEN /HPF (ref 0–2)
Specific Gravity, Urine: 1.004 (ref 1.001–1.035)
WBC, UA: NONE SEEN /HPF (ref 0–5)
pH: 6 (ref 5.0–8.0)

## 2023-04-26 LAB — NO CULTURE INDICATED

## 2023-04-26 MED ORDER — NYSTATIN-TRIAMCINOLONE 100000-0.1 UNIT/GM-% EX OINT
1.0000 | TOPICAL_OINTMENT | Freq: Two times a day (BID) | CUTANEOUS | 0 refills | Status: DC
Start: 2023-04-26 — End: 2023-07-12

## 2023-04-26 NOTE — Progress Notes (Signed)
Robin Henson [birth year] [id number]   History:  62 y.o. vaginal itching, no discharge, no odor, leaking urine with coughing/sneezing has been wearing a scented liner for leakage. Itching is present more on the outside. Not using imvexxy regularly. Interested in adding testosterone to her HRT regimen to help with weight loss and building lean muscle mass as well as for her low libido.  Gynecologic History Hysterectomy  Sexually active: low libido   Past medical history, past surgical history, family history and social history were all reviewed and documented in the EPIC chart.  ROS:  A ROS was performed and pertinent positives and negatives are included.  Exam:  Vitals:   04/26/23 1442  BP: 112/70   There is no height or weight on file to calculate BMI.  General appearance:  Normal Abdominal  Soft,nontender, without masses, guarding or rebound.  Liver/spleen:  No organomegaly noted  Hernia:  None appreciated Genitourinary   Inguinal/mons:  Normal without inguinal adenopathy  External genitalia:  erythema  BUS/Urethra/Skene's glands:  Normal  Vagina:  thin white d/c. Atrophy moderate. Poor vaginal tone.  Cervix:  absent  Uterus:  absent  Adnexa/parametria:     Rt: Normal in size, without masses or tenderness.   Lt: Normal in size, without masses or tenderness.  Anus and perineum: Normal   Raynelle Fanning, CMA present for exam  Urine dipstick shows negative for all components.  Micro exam: negative for WBC's or RBC's.   Assessment/Plan:   1. Urinary incontinence, unspecified type Normal u/a- will send for PFPT - Urinalysis,Complete w/RFL Culture - Ambulatory referral to Physical Therapy  2. Vulvovaginitis - SureSwab Advanced Candida Vaginitis (CV), TMA - nystatin-triamcinolone ointment (MYCOLOG); Apply 1 Application topically 2 (two) times daily.  Dispense: 30 g; Refill: 0 - Stop using scented pantiliners for incontinence  3. Hormone replacement therapy  (HRT) Continue current estrogen dosing Continue imvexxy twice weekly Interested in starting testosterone to help with lean muscle mass and libido [facility] for Testosterone 4%PLO gel a pea sized amount to the inner thigh nightly   >80mins spent with patient on management, counseling and coordination  Mylene Bow B WHNP-BC 3:53 PM 04/26/2023

## 2023-04-27 LAB — SURESWAB® ADVANCED CANDIDA VAGINITIS (CV), TMA
CANDIDA SPECIES: DETECTED — AB
Candida glabrata: NOT DETECTED

## 2023-04-28 ENCOUNTER — Ambulatory Visit
Admission: RE | Admit: 2023-04-28 | Discharge: 2023-04-28 | Disposition: A | Discharge status: Home / Self Care | Payer: BC Managed Care – PPO | Source: Ambulatory Visit | Attending: General Surgery | Admitting: General Surgery

## 2023-04-28 DIAGNOSIS — R928 Other abnormal and inconclusive findings on diagnostic imaging of breast: Secondary | ICD-10-CM | POA: Insufficient documentation

## 2023-04-28 MED ORDER — FLUCONAZOLE 150 MG PO TABS
ORAL_TABLET | ORAL | 0 refills | Status: DC
Start: 2023-04-28 — End: 2023-06-15

## 2023-04-28 NOTE — Telephone Encounter (Signed)
Rx sent for yeast. Please advise pt for other inquiry. Thanks.

## 2023-04-28 NOTE — Telephone Encounter (Signed)
It may not be the correct probiotic. I would recommend garden of life women's probiotic twice a day for her.

## 2023-04-28 NOTE — Telephone Encounter (Signed)
Spoke with patient, advised per Jami.   Patient verbalizes understanding and is agreeable.

## 2023-04-28 NOTE — Addendum Note (Signed)
Addended by: Jodelle Red D on: 04/28/2023 09:54 AM   Modules accepted: Orders
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
## 2023-05-22 ENCOUNTER — Ambulatory Visit: Payer: BC Managed Care – PPO | Attending: Radiology | Admitting: Physical Therapy

## 2023-05-22 ENCOUNTER — Encounter: Payer: Self-pay | Admitting: Physical Therapy

## 2023-05-22 ENCOUNTER — Other Ambulatory Visit: Payer: Self-pay

## 2023-05-22 DIAGNOSIS — M6281 Muscle weakness (generalized): Secondary | ICD-10-CM | POA: Diagnosis present

## 2023-05-22 DIAGNOSIS — R32 Unspecified urinary incontinence: Secondary | ICD-10-CM | POA: Insufficient documentation

## 2023-05-22 DIAGNOSIS — R279 Unspecified lack of coordination: Secondary | ICD-10-CM | POA: Diagnosis not present

## 2023-06-01 ENCOUNTER — Ambulatory Visit: Payer: BC Managed Care – PPO | Admitting: Physical Therapy

## 2023-06-01 DIAGNOSIS — M6281 Muscle weakness (generalized): Secondary | ICD-10-CM | POA: Diagnosis not present

## 2023-06-01 DIAGNOSIS — R279 Unspecified lack of coordination: Secondary | ICD-10-CM
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
## 2023-06-08 ENCOUNTER — Ambulatory Visit: Payer: BC Managed Care – PPO | Admitting: Physical Therapy

## 2023-06-13 ENCOUNTER — Encounter: Payer: BC Managed Care – PPO | Admitting: Physical Therapy

## 2023-06-15 ENCOUNTER — Other Ambulatory Visit: Payer: Self-pay

## 2023-06-15 DIAGNOSIS — B379 Candidiasis, unspecified: Secondary | ICD-10-CM

## 2023-06-15 MED ORDER — FLUCONAZOLE 150 MG PO TABS: ORAL_TABLET | ORAL | 0 refills | Status: DC

## 2023-06-15 NOTE — Telephone Encounter (Signed)
I do not recommend honeypot products, they can alter the pH. The dove white bar is the best or use or just plain water. We can send in another prescription for fluconazole 150mg  for her to try. I would also use coconut oil for the dryness.

## 2023-06-20 ENCOUNTER — Encounter: Payer: BC Managed Care – PPO | Admitting: Physical Therapy

## 2023-07-02 ENCOUNTER — Telehealth: Payer: Self-pay | Admitting: Family Medicine

## 2023-07-02 DIAGNOSIS — Z0000 Encounter for general adult medical examination without abnormal findings: Secondary | ICD-10-CM

## 2023-07-02 DIAGNOSIS — E559 Vitamin D deficiency, unspecified: Secondary | ICD-10-CM

## 2023-07-02 DIAGNOSIS — E538 Deficiency of other specified B group vitamins: Secondary | ICD-10-CM

## 2023-07-02 DIAGNOSIS — E7800 Pure hypercholesterolemia, unspecified: Secondary | ICD-10-CM

## 2023-07-02 DIAGNOSIS — M810 Age-related osteoporosis without current pathological fracture: Secondary | ICD-10-CM

## 2023-07-02 NOTE — Telephone Encounter (Signed)
-----   Message from Vincenza Hews sent at 06/20/2023  1:57 PM EST ----- Regarding: Lab Wed. 07/05/23 Hello,  Patient is coming in for CPE labs on Wednesday 07/05/23. Can we get orders please.   Thanks

## 2023-07-05 ENCOUNTER — Other Ambulatory Visit (INDEPENDENT_AMBULATORY_CARE_PROVIDER_SITE_OTHER): Payer: BC Managed Care – PPO

## 2023-07-05 DIAGNOSIS — E559 Vitamin D deficiency, unspecified: Secondary | ICD-10-CM

## 2023-07-05 DIAGNOSIS — Z0000 Encounter for general adult medical examination without abnormal findings: Secondary | ICD-10-CM

## 2023-07-05 DIAGNOSIS — E538 Deficiency of other specified B group vitamins: Secondary | ICD-10-CM

## 2023-07-05 DIAGNOSIS — E7800 Pure hypercholesterolemia, unspecified: Secondary | ICD-10-CM

## 2023-07-05 DIAGNOSIS — M810 Age-related osteoporosis without current pathological fracture: Secondary | ICD-10-CM

## 2023-07-05 NOTE — Addendum Note (Signed)
Addended by: Vincenza Hews on: 07/05/2023 08:57 AM   Modules accepted: Orders

## 2023-07-06 LAB — CBC WITH DIFFERENTIAL/PLATELET
Basophils Absolute: 0 10*3/uL (ref 0.0–0.2)
Basos: 1 %
EOS (ABSOLUTE): 0 10*3/uL (ref 0.0–0.4)
Eos: 1 %
Hematocrit: 42 % (ref 34.0–46.6)
Hemoglobin: 13.9 g/dL (ref 11.1–15.9)
Immature Grans (Abs): 0 10*3/uL (ref 0.0–0.1)
Immature Granulocytes: 0 %
Lymphocytes Absolute: 1.6 10*3/uL (ref 0.7–3.1)
Lymphs: 34 %
MCH: 31.5 pg (ref 26.6–33.0)
MCHC: 33.1 g/dL (ref 31.5–35.7)
MCV: 95 fL (ref 79–97)
Monocytes Absolute: 0.4 10*3/uL (ref 0.1–0.9)
Monocytes: 8 %
Neutrophils Absolute: 2.6 10*3/uL (ref 1.4–7.0)
Neutrophils: 56 %
Platelets: 208 10*3/uL (ref 150–450)
RBC: 4.41 x10E6/uL (ref 3.77–5.28)
RDW: 12.4 % (ref 11.7–15.4)
WBC: 4.6 10*3/uL (ref 3.4–10.8)

## 2023-07-06 LAB — COMPREHENSIVE METABOLIC PANEL
ALT: 13 [IU]/L (ref 0–32)
AST: 17 [IU]/L (ref 0–40)
Albumin: 4.2 g/dL (ref 3.8–4.9)
Alkaline Phosphatase: 46 [IU]/L (ref 44–121)
BUN/Creatinine Ratio: 25 (ref 12–28)
BUN: 14 mg/dL (ref 8–27)
Bilirubin Total: 0.5 mg/dL (ref 0.0–1.2)
CO2: 25 mmol/L (ref 20–29)
Calcium: 9.1 mg/dL (ref 8.7–10.3)
Chloride: 102 mmol/L (ref 96–106)
Creatinine, Ser: 0.55 mg/dL — ABNORMAL LOW (ref 0.57–1.00)
Globulin, Total: 2.3 g/dL (ref 1.5–4.5)
Glucose: 89 mg/dL (ref 70–99)
Potassium: 4.1 mmol/L (ref 3.5–5.2)
Sodium: 138 mmol/L (ref 134–144)
Total Protein: 6.5 g/dL (ref 6.0–8.5)
eGFR: 105 mL/min/{1.73_m2} (ref >59)

## 2023-07-06 LAB — LIPID PANEL
Chol/HDL Ratio: 2.9 {ratio} (ref 0.0–4.4)
Cholesterol, Total: 178 mg/dL (ref 100–199)
HDL: 62 mg/dL (ref >39)
LDL Chol Calc (NIH): 104 mg/dL — ABNORMAL HIGH (ref 0–99)
Triglycerides: 62 mg/dL (ref 0–149)
VLDL Cholesterol Cal: 12 mg/dL (ref 5–40)

## 2023-07-06 LAB — VITAMIN B12: Vitamin B-12: 727 pg/mL (ref 232–1245)

## 2023-07-06 LAB — VITAMIN D 25 HYDROXY (VIT D DEFICIENCY, FRACTURES): Vit D, 25-Hydroxy: 32.3 ng/mL (ref 30.0–100.0)

## 2023-07-06 LAB — TSH: TSH: 2.07 u[IU]/mL (ref 0.450–4.500)

## 2023-07-12 ENCOUNTER — Ambulatory Visit: Payer: BC Managed Care – PPO | Admitting: Family Medicine

## 2023-07-12 ENCOUNTER — Encounter: Payer: Self-pay | Admitting: Family Medicine

## 2023-07-12 VITALS — BP 118/76 | HR 65 | Temp 97.9°F | Ht 62.25 in | Wt 144.5 lb

## 2023-07-12 DIAGNOSIS — E7800 Pure hypercholesterolemia, unspecified: Secondary | ICD-10-CM

## 2023-07-12 DIAGNOSIS — E559 Vitamin D deficiency, unspecified: Secondary | ICD-10-CM

## 2023-07-12 DIAGNOSIS — M810 Age-related osteoporosis without current pathological fracture: Secondary | ICD-10-CM

## 2023-07-12 DIAGNOSIS — Z0000 Encounter for general adult medical examination without abnormal findings: Secondary | ICD-10-CM

## 2023-07-12 DIAGNOSIS — Z23 Encounter for immunization: Secondary | ICD-10-CM | POA: Diagnosis not present

## 2023-07-12 DIAGNOSIS — E538 Deficiency of other specified B group vitamins: Secondary | ICD-10-CM

## 2023-07-12 DIAGNOSIS — N951 Menopausal and female climacteric states: Secondary | ICD-10-CM

## 2023-07-12 DIAGNOSIS — Z85820 Personal history of malignant melanoma of skin: Secondary | ICD-10-CM

## 2023-07-12 NOTE — Assessment & Plan Note (Signed)
Reviewed health habits including diet and exercise and skin cancer prevention Reviewed appropriate screening tests for age  Also reviewed health mt list, fam hx and immunization status , as well as social and family history   See HPI Labs reviewed and ordered Flu shot given  Will consider Td later  Will consider shingrix later  Sees gyn  Discussed fall prevention, supplements and exercise for bone density  Ordered dexa  Reviewed last PHQ   Health Maintenance  Topic Date Due   DEXA scan (bone density measurement)  07/01/2023   Zoster (Shingles) Vaccine (1 of 2) 10/10/2023*   DTaP/Tdap/Td vaccine (4 - Td or Tdap) 07/11/2024*   COVID-19 Vaccine (4 - 2023-24 season) 07/27/2025*   Mammogram  04/24/2024   Colon Cancer Screening  12/28/2024   Pap with HPV screening  10/17/2025   Flu Shot  Completed   Hepatitis C Screening  Completed   HIV Screening  Completed   HPV Vaccine  Aged Out  *Topic was postponed. The date shown is not the original due date.

## 2023-07-12 NOTE — Assessment & Plan Note (Signed)
Disc goals for lipids and reasons to control them Rev last labs with pt Rev low sat fat diet in detail  Diet controlled  Stable  LDL 104

## 2023-07-12 NOTE — Assessment & Plan Note (Signed)
Last vitamin D Lab Results  Component Value Date   VD25OH 32.3 07/05/2023   Low normal  Encouraged go get back on her regular D3 for bone and overall health
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
## 2023-07-12 NOTE — Assessment & Plan Note (Signed)
Dexa ordered  Encouraged to get back on track with vitamin D3  Discussed fall prevention, supplements and exercise for bone density   Pt declines treatment so far  May consider if bmd worsens No falls or fracture  On HRT now which may help

## 2023-07-12 NOTE — Assessment & Plan Note (Signed)
On HRT now from gyn  Doing better

## 2023-07-12 NOTE — Patient Instructions (Addendum)
If you are interested in the shingles vaccine series (Shingrix), call your insurance or pharmacy to check on coverage and location it must be given.  If affordable - you can schedule it here or at your pharmacy depending on coverage   Flu shot today   Tetanus shot is due when you want to get it   Get back to your gyn for the vaginal symptoms   Get back on vitamin D3  Try and be compliant with this - very important for bone health  Take at least 2000 international units daily   Keep walking or do other cardio Add some strength training to your routine, this is important for bone and brain health and can reduce your risk of falls and help your body use insulin properly and regulate weight  Light weights, exercise bands , and internet videos are a good way to start  Yoga (chair or regular), machines , floor exercises or a gym with machines are also good options  Goal is 5 days per week at least   For cholesterol Avoid red meat/ fried foods/ egg yolks/ fatty breakfast meats/ butter, cheese and high fat dairy/ and shellfish    You have an order for:  []   2D Mammogram  []   3D Mammogram  [x]   Bone Density   Please call to schedule    Please call for appointment:   []   [facility]  [address] [place] [address]  [phone]  [x]   [facility])   [address]  [place], [place] [address]  [phone]  []   [facility]      [address] [place], [place]        [phone]         []   [facility]  [address] [place], [place]  [phone]  []  [facility]   [address]   [place], [place] [address]  [phone]  []  [facility]  [address] [place], [place] [address] [phone]    Make sure to wear two  piece clothing  No lotions powders or deodorants the day of the appointment Make sure to bring picture ID and insurance card.  Bring list of medications you are currently taking including any supplements.   Schedule your screening mammogram through MyChart!   Select [facility] imaging sites can now be scheduled through MyChart.  Log into your MyChart account.  Go to 'Visit' (or 'Appointments' if  on mobile App) --> Schedule an  Appointment  Under 'Select a Reason for Visit' choose the Mammogram  Screening option.  Complete the pre-visit questions  and select the time and place that  best fits your schedule

## 2023-07-12 NOTE — Assessment & Plan Note (Signed)
Pt sees derm yearly  Utd with care Uses sun protecton

## 2023-07-12 NOTE — Assessment & Plan Note (Addendum)
Lab Results  Component Value Date   VITAMINB12 727 07/05/2023   Enc her to continue shots every 6 wk

## 2023-08-16 ENCOUNTER — Encounter: Payer: Self-pay | Admitting: Family Medicine

## 2023-08-16 ENCOUNTER — Ambulatory Visit
Admission: RE | Admit: 2023-08-16 | Discharge: 2023-08-16 | Disposition: A | Discharge status: Home / Self Care | Payer: BC Managed Care – PPO | Source: Ambulatory Visit | Attending: Family Medicine | Admitting: Family Medicine

## 2023-08-16 DIAGNOSIS — M810 Age-related osteoporosis without current pathological fracture: Secondary | ICD-10-CM | POA: Insufficient documentation

## 2023-09-20 ENCOUNTER — Ambulatory Visit: Payer: BC Managed Care – PPO | Admitting: Internal Medicine

## 2023-09-20 ENCOUNTER — Encounter: Payer: Self-pay | Admitting: Internal Medicine

## 2023-09-20 ENCOUNTER — Ambulatory Visit: Payer: Self-pay | Admitting: Family Medicine

## 2023-09-20 VITALS — BP 122/70 | HR 61 | Temp 98.4°F | Ht 62.25 in | Wt 145.0 lb

## 2023-09-20 DIAGNOSIS — R1030 Lower abdominal pain, unspecified: Secondary | ICD-10-CM | POA: Diagnosis not present

## 2023-09-20 LAB — POC URINALSYSI DIPSTICK (AUTOMATED)
Blood, UA: NEGATIVE
Glucose, UA: POSITIVE — AB
Ketones, UA: NEGATIVE
Nitrite, UA: POSITIVE
Protein, UA: NEGATIVE
Spec Grav, UA: 1.01 (ref 1.010–1.025)
Urobilinogen, UA: 4 U/dL — AB
pH, UA: 6.5 (ref 5.0–8.0)

## 2023-09-20 MED ORDER — FLUCONAZOLE 150 MG PO TABS: 150.0000 mg | ORAL_TABLET | ORAL | 1 refills | Status: DC

## 2023-09-20 MED ORDER — CEPHALEXIN 500 MG PO CAPS: 500.0000 mg | ORAL_CAPSULE | Freq: Three times a day (TID) | ORAL | 1 refills | Status: DC

## 2023-09-20 NOTE — Assessment & Plan Note (Signed)
Predominantly suprapubic and has some urinary symptoms Urinalysis 2+ leuks, 1+ nitrite Likely cystitis Will treat with cephalexin 500 tid x 3 days (with refill) Send culture Fluconazole for secondary yeast she usually gets

## 2023-09-20 NOTE — Telephone Encounter (Signed)
I will assess her at the OV

## 2023-09-20 NOTE — Telephone Encounter (Signed)
Routing to Dr. Alphonsus Sias who is seeing pt today and PCP as a [place]
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
## 2023-09-20 NOTE — Telephone Encounter (Signed)
Chief Complaint: Urinary pain Symptoms: Nauseous, urgency, flank pain Pertinent Negatives: Patient denies blood in urine Disposition: [] ED /[] Urgent Care (no appt availability in office) / [x] Appointment(In office/virtual)/ []  [place]/ [] Home Care/ [] Refused Recommended Disposition /[] [facility]/ []  Follow-up with PCP Additional Notes: Pt states she thinks she might have a UTI. Pt states she has lower back pain that started on Monday on the left side. Pt states yesterday the pain progressed. Pt states she was not able to sleep. Pt took ibuprofen and AZO. Pt states the main symptom is pain and she feels like she constantly has to go to the bathroom. An appt was made for today. This RN educated pt on home care, new-worsening symptoms, when to call back/seek emergent care. Pt verbalized understanding and agrees to plan.    Copied from CRM [id number]. Topic: Clinical - Red Word Triage >> Sep 20, 2023 10:58 AM Florestine Avers wrote: Red Word that prompted transfer to Nurse Triage: Patient called in saying  she has a possible UTI. Is requesting same day appointment. Reason for Disposition  [1] SEVERE pain with urination (e.g., excruciating) AND [2] not improved after 2 hours of pain medicine and Sitz bath  Answer Assessment - Initial Assessment Questions 1. SEVERITY: "How bad is the pain?"  (e.g., Scale 1-10; mild, moderate, or severe)   - MILD (1-3): complains slightly about urination hurting   - MODERATE (4-7): interferes with normal activities     - SEVERE (8-10): excruciating, unwilling or unable to urinate because of the pain      7-8 3. PATTERN: "Is pain present every time you urinate or just sometimes?"      Yes 4. ONSET: "When did the painful urination start?"      Yesterday 5. FEVER: "Do you have a fever?" If Yes, ask: "What is your temperature, how was it measured, and when did it start?"     Denies 8. OTHER SYMPTOMS: "Do you have any other symptoms?" (e.g., blood in  urine, flank pain, genital sores, urgency, vaginal discharge)     Flank pain, urgency  Protocols used: Urination Pain - Female-A-AH

## 2023-09-21 LAB — URINE CULTURE
MICRO NUMBER:: 16075420
Result:: NO GROWTH
SPECIMEN QUALITY:: ADEQUATE

## 2023-09-22 ENCOUNTER — Encounter: Payer: Self-pay | Admitting: Internal Medicine

## 2023-10-05 ENCOUNTER — Other Ambulatory Visit: Payer: Self-pay | Admitting: Family Medicine

## 2023-10-19 ENCOUNTER — Ambulatory Visit (INDEPENDENT_AMBULATORY_CARE_PROVIDER_SITE_OTHER): Payer: BC Managed Care – PPO | Admitting: Radiology

## 2023-10-19 ENCOUNTER — Encounter: Payer: Self-pay | Admitting: Radiology

## 2023-10-19 VITALS — BP 102/64 | HR 61 | Ht 62.5 in | Wt 141.0 lb

## 2023-10-19 DIAGNOSIS — N958 Other specified menopausal and perimenopausal disorders: Secondary | ICD-10-CM | POA: Diagnosis not present

## 2023-10-19 DIAGNOSIS — Z1331 Encounter for screening for depression: Secondary | ICD-10-CM

## 2023-10-19 DIAGNOSIS — Z79890 Hormone replacement therapy (postmenopausal): Secondary | ICD-10-CM | POA: Diagnosis not present

## 2023-10-19 DIAGNOSIS — Z01419 Encounter for gynecological examination (general) (routine) without abnormal findings: Secondary | ICD-10-CM | POA: Diagnosis not present

## 2023-10-19 DIAGNOSIS — M810 Age-related osteoporosis without current pathological fracture: Secondary | ICD-10-CM

## 2023-10-19 MED ORDER — ESTRADIOL 10 MCG VA TABS: 1.0000 | ORAL_TABLET | VAGINAL | 11 refills | Status: DC

## 2023-10-19 MED ORDER — ESTRADIOL 0.1 MG/24HR TD PTTW: 1.0000 | MEDICATED_PATCH | TRANSDERMAL | 12 refills | Status: AC

## 2023-10-19 MED ORDER — PROGESTERONE MICRONIZED 100 MG PO CAPS: 100.0000 mg | ORAL_CAPSULE | Freq: Every day | ORAL | 4 refills | Status: AC

## 2023-10-19 NOTE — Patient Instructions (Signed)
 Preventive Care 16-61 Years Old, Female  Preventive care refers to lifestyle choices and visits with your health care provider that can promote health and wellness. Preventive care visits are also called wellness exams.  What can I expect for my preventive care visit?  Counseling  Your health care provider may ask you questions about your:  Medical history, including:  Past medical problems.  Family medical history.  Pregnancy history.  Current health, including:  Menstrual cycle.  Method of birth control.  Emotional well-being.  Home life and relationship well-being.  Sexual activity and sexual health.  Lifestyle, including:  Alcohol, nicotine or tobacco, and drug use.  Access to firearms.  Diet, exercise, and sleep habits.  Work and work Astronomer.  Sunscreen use.  Safety issues such as seatbelt and bike helmet use.  Physical exam  Your health care provider will check your:  Height and weight. These may be used to calculate your BMI (body mass index). BMI is a measurement that tells if you are at a healthy weight.  Waist circumference. This measures the distance around your waistline. This measurement also tells if you are at a healthy weight and may help predict your risk of certain diseases, such as type 2 diabetes and high blood pressure.  Heart rate and blood pressure.  Body temperature.  Skin for abnormal spots.  What immunizations do I need?    Vaccines are usually given at various ages, according to a schedule. Your health care provider will recommend vaccines for you based on your age, medical history, and lifestyle or other factors, such as travel or where you work.  What tests do I need?  Screening  Your health care provider may recommend screening tests for certain conditions. This may include:  Lipid and cholesterol levels.  Diabetes screening. This is done by checking your blood sugar (glucose) after you have not eaten for a while (fasting).  Pelvic exam and Pap test.  Hepatitis B test.  Hepatitis C  test.  HIV (human immunodeficiency virus) test.  STI (sexually transmitted infection) testing, if you are at risk.  Lung cancer screening.  Colorectal cancer screening.  Mammogram. Talk with your health care provider about when you should start having regular mammograms. This may depend on whether you have a family history of breast cancer.  BRCA-related cancer screening. This may be done if you have a family history of breast, ovarian, tubal, or peritoneal cancers.  Bone density scan. This is done to screen for osteoporosis.  Talk with your health care provider about your test results, treatment options, and if necessary, the need for more tests.  Follow these instructions at home:  Eating and drinking    Eat a diet that includes fresh fruits and vegetables, whole grains, lean protein, and low-fat dairy products.  Take vitamin and mineral supplements as recommended by your health care provider.  Do not drink alcohol if:  Your health care provider tells you not to drink.  You are pregnant, may be pregnant, or are planning to become pregnant.  If you drink alcohol:  Limit how much you have to 0-1 drink a day.  Know how much alcohol is in your drink. In the [place] one drink equals one 12 oz bottle of beer (355 mL), one 5 oz glass of wine (148 mL), or one 1 oz glass of hard liquor (44 mL).  Lifestyle  Brush your teeth every morning and night with fluoride toothpaste. Floss one time each day.  Exercise for at least  30 minutes 5 or more days each week.  Do not use any products that contain nicotine or tobacco. These products include cigarettes, chewing tobacco, and vaping devices, such as e-cigarettes. If you need help quitting, ask your health care provider.  Do not use drugs.  If you are sexually active, practice safe sex. Use a condom or other form of protection to prevent STIs.  If you do not wish to become pregnant, use a form of birth control. If you plan to become pregnant, see your health care provider for a  prepregnancy visit.  Take aspirin only as told by your health care provider. Make sure that you understand how much to take and what form to take. Work with your health care provider to find out whether it is safe and beneficial for you to take aspirin daily.  Find healthy ways to manage stress, such as:  Meditation, yoga, or listening to music.  Journaling.  Talking to a trusted person.  Spending time with friends and family.  Minimize exposure to UV radiation to reduce your risk of skin cancer.  Safety  Always wear your seat belt while driving or riding in a vehicle.  Do not drive:  If you have been drinking alcohol. Do not ride with someone who has been drinking.  When you are tired or distracted.  While texting.  If you have been using any mind-altering substances or drugs.  Wear a helmet and other protective equipment during sports activities.  If you have firearms in your house, make sure you follow all gun safety procedures.  Seek help if you have been physically or sexually abused.  What's next?  Visit your health care provider once a year for an annual wellness visit.  Ask your health care provider how often you should have your eyes and teeth checked.  Stay up to date on all vaccines.  This information is not intended to replace advice given to you by your health care provider. Make sure you discuss any questions you have with your health care provider.  Document Revised: 01/20/2021 Document Reviewed: 01/20/2021  Elsevier Patient Education  2024 [employer].

## 2023-10-19 NOTE — Progress Notes (Signed)
 Robin Henson [birth year] [id number]   History:  63 y.o. G2P2 presents for annual exam. Osteoporosis has improved some since being on estrogen. Interested in treatment. Hx of humerus fracture. No further yeast infections. Stopped imvexxy because she had burning, would like another option. No other new gyn concerns.  Gynecologic History Hysterectomy   OB History     Gravida  2   Para  2   Term      Preterm      AB      Living  3      SAB      IAB      Ectopic      Multiple  1   Live Births           Obstetric Comments  Menstrual age: 49  Age 1st Pregnancy:          Health Maintenance Last Pap: 10/2022. Results were: normal Last mammogram: 04/2023. Results were: normal Last colonoscopy: 2016 Last Dexa: 1/25. Results were: improved osteoporosis HRT use: current     10/19/2023   10:27 AM 09/20/2023   12:27 PM 07/12/2023   11:45 AM  Depression screen PHQ 2/9  Decreased Interest 0 0 0  Down, Depressed, Hopeless 0 0 0  PHQ - 2 Score 0 0 0  Altered sleeping  3 3  Tired, decreased energy  3 1  Change in appetite  0 0  Feeling bad or failure about yourself   0 0  Trouble concentrating  0 0  Moving slowly or fidgety/restless  0 0  Suicidal thoughts  0 0  PHQ-9 Score  6 4  Difficult doing work/chores  Not difficult at all Not difficult at all     Past medical history, past surgical history, family history and social history were all reviewed and documented in the EPIC chart.  ROS:  A ROS was performed and pertinent positives and negatives are included.  Exam:  Vitals:   10/19/23 1028  BP: 102/64  Pulse: 61  SpO2: 99%  Weight: 141 lb (64 kg)  Height: 5' 2.5" (1.588 m)   Body mass index is 25.38 kg/m.  General appearance:  Normal Thyroid:  Symmetrical, normal in size, without palpable masses or nodularity. Respiratory  Auscultation:  Clear without wheezing or rhonchi Cardiovascular  Auscultation:  Regular rate, without rubs, murmurs or  gallops  Edema/varicosities:  Not grossly evident Abdominal  Soft,nontender, without masses, guarding or rebound.  Liver/spleen:  No organomegaly noted  Hernia:  None appreciated  Skin  Inspection:  Grossly normal Breasts: Examined lying and sitting.   Right: Without masses, retractions, nipple discharge or axillary adenopathy.   Left: Without masses, retractions, nipple discharge or axillary adenopathy. Genitourinary   Inguinal/mons:  Normal without inguinal adenopathy  External genitalia:  Normal appearing vulva with no masses, tenderness, or lesions  BUS/Urethra/Skene's glands:  Normal  Vagina:  Normal appearing with normal color and discharge, no lesions. Atrophy moderate  Cervix:  absent  Uterus:  absent  Adnexa/parametria:     Rt: Normal in size, without masses or tenderness.   Lt: Normal in size, without masses or tenderness.  Anus and perineum: Normal   Raynelle Fanning, CMA present for exam  Assessment/Plan:   1. Well woman exam with routine gynecological exam (Primary) Pap up to date Mammo yearly  2. Age-related osteoporosis without current pathological fracture Will increase estrogen dose and precert evenity, hx of humerus fx  3. Hormone replacement therapy (HRT) - progesterone (PROMETRIUM) 100  MG capsule; Take 1 capsule (100 mg total) by mouth daily.  Dispense: 90 capsule; Refill: 4  4. Genitourinary syndrome of menopause Would like to switch from imvexxy- had burning, does not want a cream, too messy - Estradiol (YUVAFEM) 10 MCG TABS vaginal tablet; Place 1 tablet (10 mcg total) vaginally 2 (two) times a week.  Dispense: 8 tablet; Refill: 11    Amylia Collazos B WHNP-BC 11:02 AM 10/19/2023

## 2023-10-24 ENCOUNTER — Other Ambulatory Visit: Payer: Self-pay | Admitting: *Deleted

## 2023-10-24 DIAGNOSIS — M810 Age-related osteoporosis without current pathological fracture: Secondary | ICD-10-CM

## 2023-10-24 NOTE — Telephone Encounter (Signed)
 Insurance information submitted to [employer] portal. Will await summary of benefits for Evenity.

## 2023-10-24 NOTE — Telephone Encounter (Signed)
-----   Message from [place], [place] B sent at 10/19/2023 11:01 AM EDT ----- Regarding: Evenity Please precert Evenity. Osteoporosis with some improvement on estrogen, hx of post menopausal humerus fracture.

## 2023-11-01 NOTE — Telephone Encounter (Signed)
 Let's try Prolia. Thanks.

## 2023-11-01 NOTE — Telephone Encounter (Signed)
 Does that also exclude prolia? Or can we appeal?

## 2023-11-01 NOTE — Telephone Encounter (Signed)
Insurance information submitted to [employer] portal. Will await summary of benefits for prolia.    

## 2023-11-01 NOTE — Telephone Encounter (Signed)
 Message from [employer] support stating, "your patient's Medical Health Plan advised that Robin Henson is not covered through your patient's Medical benefit at this time, or that the proposed use of Evenity is not consistent with the payer's policy and therefore not covered, because the Product/Service is not covered under Medical Benefit."  Patient has [place] [facility] [place].  Routing to provider to review and advise.

## 2023-11-01 NOTE — Telephone Encounter (Signed)
 This was specific to Evenity benefits, I can submit for Prolia.

## 2023-11-01 NOTE — Telephone Encounter (Signed)
 Thank you :)

## 2023-11-01 NOTE — Telephone Encounter (Signed)
 Call to [place] [employer] [place], spoke with Watt Climes, Advocate who states Robin Henson is not covered by this patient's particular plan. Will not be able to submit an appeal as there has been no authorization that has been denied. If patient has tried and failed others: prolia or forteo, could submit a letter of medical necessity for predetermination. If patient has not tried alternatives, would need to try those first. Reference #: [id number].   Routing to provider s FYI. Will submit insurance for prolia benefits.

## 2023-11-07 NOTE — Telephone Encounter (Signed)
 Benefits for Evenity will be rerun with [employer] per Amy, Rep.

## 2023-11-07 NOTE — Telephone Encounter (Signed)
 Irving Burton-  Do we have any follow up from the rep regarding coverage for her?

## 2023-11-07 NOTE — Telephone Encounter (Signed)
 Call to patient. Updated that insurance not wanting to cover Evenity and working with [employer] Reps on coverage. Patient states she has a history of GERD and had a previous fracture in wrist from a fall 10 years ago. RN advised once she heard back from [employer] Reps would update. Patient agreeable.

## 2023-11-14 ENCOUNTER — Other Ambulatory Visit (HOSPITAL_COMMUNITY): Payer: Self-pay

## 2023-11-14 ENCOUNTER — Other Ambulatory Visit: Payer: Self-pay | Admitting: Pharmacy Technician

## 2023-11-14 ENCOUNTER — Other Ambulatory Visit: Payer: Self-pay

## 2023-11-14 MED ORDER — ROMOSOZUMAB-AQQG 105 MG/1.17ML ~~LOC~~ SOSY
210.0000 mg | PREFILLED_SYRINGE | Freq: Once | SUBCUTANEOUS | 0 refills | Status: AC
  Filled 2023-11-14: qty 3.51, 1d supply, fill #0

## 2023-11-14 NOTE — Telephone Encounter (Signed)
 Call to patient. Advised per [employer] Rep, will see if pharmacy will cover Evenity injections. Advised patient per Jomarie Longs await benefits from [facility], advised to not pay, but instead return call to RN to compare benefits and proceed with Evenity copay card. Patient agreeable to plan. Per Jomarie Longs try sending to [employer].   Medication pended for provider review.

## 2023-11-15 ENCOUNTER — Other Ambulatory Visit: Payer: Self-pay

## 2023-11-16 ENCOUNTER — Other Ambulatory Visit: Payer: Self-pay

## 2023-11-16 NOTE — Telephone Encounter (Signed)
 We can try Prolia instead.

## 2023-11-16 NOTE — Telephone Encounter (Signed)
 Call received from Tiffany at [facility], was advised Evenity not on formulary, covered alternatives are Prolia or Tymlos.   Routing to [employer].   Cc: Clearnce Hasten

## 2023-11-16 NOTE — Progress Notes (Signed)
 Evenity is non-formulary. Spoke with provider and they advised that they will look into other options for patient. Insurance prefers Acupunct[facility]ist. Dis-enrolling for now.

## 2023-11-20 ENCOUNTER — Telehealth: Payer: Self-pay | Admitting: *Deleted

## 2023-11-20 NOTE — Telephone Encounter (Signed)
 Call to patient. Advised per [facility] pharmacy, [place] is not on formulary, per Jami, NP okay for prolia.   Insurance information submitted to [employer] portal. Will await summary of benefits for prolia.

## 2023-11-20 NOTE — Telephone Encounter (Signed)
 Chrzanowski, Jami B, NP to Yale Held, RN  Me     11/16/23  8:57 AM Note We can try Prolia instead.      11/16/23  8:47 AM Hamm, Emmie Harness, RN routed this conversation to Arlene Ben, CMA  Laine Piggs, NP  Yale Held, RN     11/16/23  8:47 AM Note Call received from Tiffany at [facility], was advised Evenity not on formulary, covered alternatives are Prolia or Tymlos.    Routing to [employer].    Cc: Jami

## 2023-11-20 NOTE — Telephone Encounter (Signed)
 Will run benefits for prolia.   Encounter closed.

## 2024-01-04 NOTE — Telephone Encounter (Signed)
 PA for prolia signed by Jyl Or, covering provider for [facility], and faxed to [employer] at [phone]. Will await response. Patient updated via MyChart.

## 2024-01-30 NOTE — Telephone Encounter (Signed)
 [employer] does not handle PA for this patient's plan.Medical predetermination of benefits request form faxed to [employer] at [phone]. Will await response.

## 2024-03-11 ENCOUNTER — Other Ambulatory Visit: Payer: Self-pay | Admitting: *Deleted

## 2024-03-11 DIAGNOSIS — M810 Age-related osteoporosis without current pathological fracture: Secondary | ICD-10-CM

## 2024-03-11 MED ORDER — DENOSUMAB 60 MG/ML ~~LOC~~ SOSY: 60.0000 mg | PREFILLED_SYRINGE | Freq: Once | SUBCUTANEOUS | Status: AC

## 2024-03-11 NOTE — Telephone Encounter (Signed)
 See prolia  referral dated 03/11/24.   Encounter closed.

## 2024-03-11 NOTE — Telephone Encounter (Signed)
 Call to [facility], spoke with [place], who states [employer] does not use authorization numbers, just keeps the J code on file and then knows to pay. Reference # for call [id number].

## 2024-03-28 ENCOUNTER — Other Ambulatory Visit: Payer: Self-pay | Admitting: Family Medicine

## 2024-04-01 ENCOUNTER — Ambulatory Visit

## 2024-05-03 ENCOUNTER — Encounter: Payer: Self-pay | Admitting: Podiatry

## 2024-05-03 ENCOUNTER — Ambulatory Visit: Admitting: Podiatry

## 2024-05-03 ENCOUNTER — Telehealth: Payer: Self-pay | Admitting: *Deleted

## 2024-05-03 ENCOUNTER — Ambulatory Visit (INDEPENDENT_AMBULATORY_CARE_PROVIDER_SITE_OTHER)

## 2024-05-03 DIAGNOSIS — M7752 Other enthesopathy of left foot: Secondary | ICD-10-CM | POA: Diagnosis not present

## 2024-05-03 DIAGNOSIS — M722 Plantar fascial fibromatosis: Secondary | ICD-10-CM | POA: Diagnosis not present

## 2024-05-03 MED ORDER — TRIAMCINOLONE ACETONIDE 10 MG/ML IJ SUSP
10.0000 mg | Freq: Once | INTRAMUSCULAR | Status: AC
  Administered 2024-05-03: 10 mg via INTRA_ARTICULAR

## 2024-05-03 NOTE — Telephone Encounter (Signed)
 Spoke with patient. Scheduled for Prolia  injection on 05/07/24.   Patient states she was seen by foot doctor today, got a cortisone injection. See note in EPIC.   Patient has concerns about starting prolia , cortisone and her calcium levels. States she does not take oral calcium due to constipation, states she may get enough in her diet.   Patient asking if ok to proceed? Does another calcium need to be checked?   Prolia  injection cancelled for 05/07/24, will reschedule if needed after reviewed with Jami.   Last calcium on 07/05/23 was 9.1  Jami -please review and advise if ok to proceed or OV preferred.

## 2024-05-06 ENCOUNTER — Telehealth: Payer: Self-pay | Admitting: Lab

## 2024-05-06 NOTE — Telephone Encounter (Signed)
 Patient states provider placed her in a boot and now is having swelling on the foot and back pain would like to know what she should do please advise.

## 2024-05-06 NOTE — Telephone Encounter (Signed)
Ok to proceed with Prolia

## 2024-05-06 NOTE — Telephone Encounter (Signed)
 Make sure she is wearing a lift on the other foot. She can order a device for her shoe on [employer] that will lift up the shoe and help to even her out

## 2024-05-06 NOTE — Telephone Encounter (Signed)
 Spoke with patient, advised per Jami. Patient would like to further discuss prior to proceeding with Prolia . OV scheduled for 06/19/24 at 1400, patient declined earlier OV. Advised if she decides to proceed with Prolia  and no changes in insurance, can receive at this OV. Patient agreeable.   Routing FYI.   Cc: Damien

## 2024-05-06 NOTE — Progress Notes (Signed)
 Subjective:   Patient ID: Robin Henson, female   DOB: 63 y.o.   MRN: [id number]   HPI Patient presents stating still having a lot of pain in the left foot and states is not just the foot but it is into the ankle and side.  States that she has not been seen for of about a year but its gotten worse over time   ROS      Objective:  Physical Exam  Neurovascular status intact exquisite inflammation third MPJ left fluid buildup along with discomfort into the ankle and side of the foot.  Patient states her foot just feels tired and not supported     Assessment:  Inflammatory capsulitis third MPJ left acute with significant other pain in the foot and ankle     Plan:  H&P reviewed and today I did a forefoot block I aspirated the third MPJ getting amount of small amount of clear fluid and I injected quarter cc dexamethasone Kenalog .  I then went ahead and I applied air fracture walker to completely immobilize and take all stress off the foot and advised on wearing this full-time for 3 weeks with gradual reduction at that point.  Patient will be seen back to reevaluate  X-rays indicate that there is no signs of fracture or overt arthritis in this area

## 2024-05-07 ENCOUNTER — Ambulatory Visit

## 2024-05-09 ENCOUNTER — Other Ambulatory Visit: Payer: Self-pay | Admitting: General Surgery

## 2024-05-09 DIAGNOSIS — Z1231 Encounter for screening mammogram for malignant neoplasm of breast: Secondary | ICD-10-CM

## 2024-05-30 ENCOUNTER — Encounter: Payer: Self-pay | Admitting: Podiatry

## 2024-05-30 ENCOUNTER — Ambulatory Visit (INDEPENDENT_AMBULATORY_CARE_PROVIDER_SITE_OTHER): Admitting: Podiatry

## 2024-05-30 DIAGNOSIS — M7752 Other enthesopathy of left foot: Secondary | ICD-10-CM | POA: Diagnosis not present

## 2024-05-30 DIAGNOSIS — M722 Plantar fascial fibromatosis: Secondary | ICD-10-CM | POA: Diagnosis not present

## 2024-05-30 MED ORDER — CELECOXIB 200 MG PO CAPS: 200.0000 mg | ORAL_CAPSULE | Freq: Two times a day (BID) | ORAL | 3 refills | Status: DC

## 2024-05-31 NOTE — Progress Notes (Signed)
 Subjective:   Patient ID: Robin Henson, female   DOB: 63 y.o.   MRN: [id number]   HPI Patient states she is improved but she still is having quite a bit of foot pain of both feet.  States that it seems to be worse with ambulation and that her feet just feel tender   ROS      Objective:  Physical Exam  Neurovascular status intact with patient found to have continued forefoot inflammation bilateral moderate heel pain and diminishment of fat pad underneath the metatarsals bilaterally     Assessment:  Inflammatory condition with inflammation of the lesser capsules left over right and pain into the arch heel bilateral     Plan:  H&P all conditions reviewed discussed.  We looked at her orthotics and pedorthist came back looked at it with me and we both agree that a softer orthotic to offload weight would be beneficial and working to change the 1 she has had.  I did discuss with her continued anti-inflammatory boot usage as needed and shoe gear modifications.  All questions answered reappoint when orthotics return

## 2024-06-10 ENCOUNTER — Ambulatory Visit
Admission: RE | Admit: 2024-06-10 | Discharge: 2024-06-10 | Disposition: A | Discharge status: Home / Self Care | Source: Ambulatory Visit | Attending: General Surgery | Admitting: General Surgery

## 2024-06-10 DIAGNOSIS — Z1231 Encounter for screening mammogram for malignant neoplasm of breast: Secondary | ICD-10-CM | POA: Insufficient documentation

## 2024-06-19 ENCOUNTER — Encounter: Payer: Self-pay | Admitting: Radiology

## 2024-06-19 ENCOUNTER — Ambulatory Visit: Admitting: Radiology

## 2024-06-19 VITALS — BP 104/70 | HR 75 | Wt 138.0 lb

## 2024-06-19 DIAGNOSIS — M810 Age-related osteoporosis without current pathological fracture: Secondary | ICD-10-CM | POA: Diagnosis not present

## 2024-06-19 NOTE — Progress Notes (Signed)
 Robin Henson 1960/08/17 [id number]   History: Postmenopausal 63 y.o. presents to discuss Prolia  treatment for osteoporosis. Concerned about osteonecrosis of the jaw.    Gynecologic History Postmenopausal  Obstetric History OB History  Gravida Para Term Preterm AB Living  2 2    3   SAB IAB Ectopic Multiple Live Births     1     # Outcome Date GA Lbr Len/2nd Weight Sex Type Anes PTL Lv  2 Para           1 Para             Obstetric Comments  Menstrual age: 29    Age 1st Pregnancy:     The following portions of the patient's history were reviewed and updated as appropriate: allergies, current medications, past family history, past medical history, past social history, past surgical history, and problem list.  Review of Systems Pertinent items noted in HPI and remainder of comprehensive ROS otherwise negative.  Past medical history, past surgical history, family history and social history were all reviewed and documented in the EPIC chart.  Exam:  Vitals:   06/19/24 1400  BP: 104/70  Pulse: 75  SpO2: 99%  Weight: 138 lb (62.6 kg)   Body mass index is 24.84 kg/m.  Narrative & Impression  EXAM: DUAL X-RAY ABSORPTIOMETRY (DXA) FOR BONE MINERAL DENSITY   IMPRESSION: Your patient Robin Henson completed a BMD test on 08/16/2023 using the [employer] Advance DXA System (analysis version: 14.10) manufactured by [employer]. The following summarizes the results of our evaluation. Technologist: LCE PATIENT BIOGRAPHICAL: Name: Robin Henson Patient ID: [id number] Birth Date: 1960/12/31 Height: 63.0 in. Gender: Female Exam Date: 08/16/2023 Weight: 149.0 lbs. Indications: Arthritis, Caucasian, History of Fracture (Adult), Hysterectomy, Osteoporotic, POSTmenopausal Fractures: Ankle Left, Elbow Right Treatments: Estrogen, Vitamin D    ASSESSMENT: The BMD measured at Femur Neck Left is 0.621 g/cm2 with a T-score of -3.0. This patient's diagnostic category is  OSTEOPOROSIS according to World Health Organization [facility]) criteria. L-4 was excluded due to degenerative changes/compression fracture, etc. The scan quality is good.   Compared with 06/30/2021.   Since the prior study, there has been a SIGNIFICANT INCREASE in bone mineral density of the lumbar spine (+10%) and hips.   Site Region Measured Measured WHO Young Adult BMD Date       Age      Classification T-score AP Spine L1-L3 08/16/2023 60.3 Osteopenia -1.8 0.957 g/cm2 AP Spine L1-L3 06/30/2021 58.2 Osteoporosis -2.5 0.870 g/cm2 AP Spine L1-L3 04/17/2019 56.0 Osteopenia -2.3 0.891 g/cm2 AP Spine L1-L3 09/05/2016 53.4 Osteopenia -2.0 0.934 g/cm2 AP Spine L1-L3 12/19/2013 50.7 Osteopenia -1.3 1.026 g/cm2   DualFemur Neck Left 08/16/2023 60.3 Osteoporosis -3.0 0.621 g/cm2 DualFemur Neck Left 06/30/2021 58.2 Osteoporosis -2.7 0.667 g/cm2 DualFemur Neck Left 04/17/2019 56.0 Osteoporosis -2.8 0.655 g/cm2 DualFemur Neck Left 09/05/2016 53.4 Osteoporosis -2.5 0.687 g/cm2 DualFemur Neck Left 12/19/2013 50.7 Osteopenia -2.4 0.706 g/cm2   DualFemur Total Mean 08/16/2023 60.3 Osteopenia -1.9 0.765 g/cm2 DualFemur Total Mean 06/30/2021 58.2 Osteopenia -2.3 0.717 g/cm2 DualFemur Total Mean 04/17/2019 56.0 Osteopenia -2.2 0.733 g/cm2 DualFemur Total Mean 09/05/2016 53.4 Osteopenia -2.0 0.760 g/cm2 DualFemur Total Mean 12/19/2013 50.7 Osteopenia -1.7 0.795 g/cm2   World Health Organization [facility]) criteria for post-menopausal, Caucasian Women: Normal:       T-score at or above -1 SD Osteopenia:   T-score between -1 and -2.5 SD Osteoporosis: T-score at or below -2.5 SD RECOMMENDATIONS: 1. All patients should optimize calcium and vitamin  D intake. 2. Consider FDA-approved medical therapies in postmenopausal women and men aged 91 years and older, based on the following: a. A hip or vertebral (clinical or morphometric) fracture b. T-score < -2.5 at the femoral neck or spine after  appropriate evaluation to exclude secondary causes c. Low bone mass (T-score between -1.0 and -2.5 at the femoral neck or spine) and a 10-year probability of a hip fracture > 3% or a 10-year probability of a major osteoporosis-related fracture > 20% based on the [place] -adapted WHO algorithm d. Clinician judgment and/or patient preferences may indicate treatment for people with 10-year fracture probabilities above or below these levels FOLLOW-UP: People with diagnosed cases of osteoporosis or at high risk for fracture should have regular bone mineral density tests. For patients eligible for Medicare, routine testing is allowed once every 2 years. The testing frequency can be increased to one year for patients who have rapidly progressing disease, those who are receiving or discontinuing medical therapy to restore bone mass, or have additional risk factors.   I have reviewed this report, and agree with the above findings.   [facility]     Electronically Signed   By: Reyes Phi M.D.   On: 08/16/2023 14:55      Physical Exam Constitutional:      Appearance: Normal appearance.  Pulmonary:     Effort: Pulmonary effort is normal.  Neurological:     Mental Status: She is alert.  Psychiatric:        Mood and Affect: Mood normal.        Thought Content: Thought content normal.        Judgment: Judgment normal.     Darice Hoit, CMA present for exam  Assessment/Plan:   1. Age-related osteoporosis without current pathological fracture (Primary) Discussed prolia  treatment. Risks benefits and side effects reviewed. Does not want to have injection today, will schedule before the end of the year.  Jiles Goya B WHNP-BC, 2:24 PM 06/19/2024

## 2024-06-23 ENCOUNTER — Other Ambulatory Visit: Payer: Self-pay | Admitting: Family Medicine

## 2024-06-24 ENCOUNTER — Encounter: Payer: Self-pay | Admitting: Family Medicine

## 2024-06-24 ENCOUNTER — Ambulatory Visit: Admitting: Family Medicine

## 2024-06-24 VITALS — BP 112/76 | HR 59 | Temp 97.7°F | Ht 62.5 in | Wt 137.5 lb

## 2024-06-24 DIAGNOSIS — R350 Frequency of micturition: Secondary | ICD-10-CM | POA: Diagnosis not present

## 2024-06-24 DIAGNOSIS — G8929 Other chronic pain: Secondary | ICD-10-CM

## 2024-06-24 DIAGNOSIS — M545 Low back pain, unspecified: Secondary | ICD-10-CM | POA: Diagnosis not present

## 2024-06-24 DIAGNOSIS — R1030 Lower abdominal pain, unspecified: Secondary | ICD-10-CM

## 2024-06-24 LAB — POC URINALSYSI DIPSTICK (AUTOMATED)
Bilirubin, UA: NEGATIVE
Blood, UA: NEGATIVE
Glucose, UA: NEGATIVE
Ketones, UA: NEGATIVE
Leukocytes, UA: NEGATIVE
Nitrite, UA: NEGATIVE
Protein, UA: NEGATIVE
Spec Grav, UA: 1.02 (ref 1.010–1.025)
Urobilinogen, UA: 0.2 U/dL
pH, UA: 6 (ref 5.0–8.0)

## 2024-06-24 LAB — POCT UA - MICROSCOPIC ONLY
Bacteria, U Microscopic: 0
RBC, Urine, Miroscopic: 0 (ref 0–2)
WBC, Ur, HPF, POC: 0 (ref 0–5)

## 2024-06-24 NOTE — Patient Instructions (Signed)
 Urine is clear   Drink lots of water   See the chiropractor  I think your pain may be from the back   PT (physical therapy) is an option if interested  Let me know if you want a referral    Gentle stretching and heat may help
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
## 2024-06-24 NOTE — Assessment & Plan Note (Signed)
 Acute on chronic Worse on right Sometimes radiating to LEs -not today  Some pain in pelvic area- ? If radicular (urinalysis neg) Sees chiropractor regularly  Deg changes noted MRI 2021 Reassuring exam today-worst pain with ext and right lat bend  No neuro changes Suspect muscular   Will return to chiropractor  Given rehab handout for sciatica  Heat/stretching  Offered referral to PT_ pt will call if she wants this Suspect muscular

## 2024-06-24 NOTE — Assessment & Plan Note (Signed)
 Associated with back pain  Clear UA Reassuring exam ? If radicular from lumbar pain   Given handout/rehab Offered PT referral Will follow up with chiropractor  Update if not starting to improve in a week or if worsening  Call back and Er precautions noted in detail today    Low threshold to image if needed

## 2024-06-24 NOTE — Assessment & Plan Note (Signed)
 Urinalysis is clear today

## 2024-07-18 ENCOUNTER — Encounter

## 2024-07-18 ENCOUNTER — Telehealth: Payer: Self-pay

## 2024-07-18 NOTE — Telephone Encounter (Signed)
 Patient is calling in about her status on the orthotics. I saw on her last visit with Dr.Regal you did scan her feet. I do not see anything in [facility], Safestep, [facility] or anodyne. Patient saw Dr on 05/30/2024

## 2024-08-02 ENCOUNTER — Other Ambulatory Visit: Payer: Self-pay | Admitting: Radiology

## 2024-08-02 DIAGNOSIS — N958 Other specified menopausal and perimenopausal disorders: Secondary | ICD-10-CM

## 2024-08-02 NOTE — Telephone Encounter (Signed)
 Med refill request: estradiol  10 mcg vaginal tablets Last AEX: 10/19/23 Next AEX: 10/22/24 Last MMG (if hormonal med) 06/10/24 BI-RADS 1 negative Refill authorized: estradiol  10 mcg vaginal tablets #24 zero refills

## 2024-08-05 ENCOUNTER — Ambulatory Visit

## 2024-08-07 ENCOUNTER — Telehealth: Admitting: Family Medicine

## 2024-08-07 VITALS — Temp 98.7°F | Ht 62.5 in | Wt 134.0 lb

## 2024-08-07 DIAGNOSIS — J0100 Acute maxillary sinusitis, unspecified: Secondary | ICD-10-CM | POA: Diagnosis not present

## 2024-08-07 MED ORDER — FLUCONAZOLE 150 MG PO TABS: 150.0000 mg | ORAL_TABLET | Freq: Once | ORAL | 0 refills | Status: AC

## 2024-08-07 MED ORDER — DOXYCYCLINE HYCLATE 100 MG PO TABS: 100.0000 mg | ORAL_TABLET | Freq: Two times a day (BID) | ORAL | 0 refills | Status: DC

## 2024-08-07 NOTE — Progress Notes (Signed)
 "  Virtual Visit via Video Note  I connected with Alyss P Mroczkowski on 08/07/2024 at  9:30 AM EST by a video enabled telemedicine application and verified that I am speaking with the correct person using two identifiers.  Patient Location: Home Provider Location: Office/Clinic  I discussed the limitations, risks, security, and privacy concerns of performing an evaluation and management service by video and the availability of in person appointments. I also discussed with the patient that there may be a patient responsible charge related to this service. The patient expressed understanding and agreed to proceed.  Parties involved in encounter  Patient: Robin Henson  Provider:  Laine Balls MD   Subjective: PCP: Balls Laine LABOR, MD  Chief Complaint  Patient presents with   Nasal Congestion    Was sick a few weeks ago and then sxs worsened on 08/01/24   Headache   Sinusitis   Cough   Hoarse   HPI  Pt presents with  Symptoms of a sinus infection   Had a cold 2 wk ago - lost voice That got better   Started 12/25 with a headache / then facial pain  Next day- runny nose/congestion ST No fever  Just a little achey   Facial pain persists right side over and under eye  Also in nostril  A little blood - no brisk bleeding   Mucous is yellow to green / sometimes clear   Pnd  No longer sneezing  Ears hurt on and off - brief/fleeting  Scratchy throat-improved from beginning  Is hoarse   Over the counter  Sudafed PE  Flonase  ns  Trying some saline- hard time using netti- does not flow through   Face is tender today   A little cough-not bad  No wheeze or shortness of breath   Steam helps a bit    ROS: Per HPI Review of Systems  Constitutional:  Positive for malaise/fatigue. Negative for fever.  HENT:  Positive for congestion, ear pain, sinus pain and sore throat. Negative for ear discharge and nosebleeds.   Respiratory:  Positive for cough.   Gastrointestinal:   Negative for nausea and vomiting.  Skin:  Negative for rash.  Neurological:  Positive for headaches. Negative for dizziness.    Current Medications[1]  Observations/Objective: Today's Vitals   08/07/24 0914  Temp: 98.7 F (37.1 C)  TempSrc: Temporal  Weight: 134 lb (60.8 kg)  Height: 5' 2.5 (1.588 m)   Physical Exam Patient appears well, in no distress Weight is baseline  No facial swelling or asymmetry Some tenderness on self palpation of right side of face-maxillary and frontal  Mildly hoarse voice  No obvious tremor or mobility impairment Moving neck and UEs normally Able to hear the call well  No cough or shortness of breath during interview  Talkative and mentally sharp with no cognitive changes No skin changes on face or neck , no rash or pallor Affect is normal   Assessment and Plan: Acute non-recurrent maxillary sinusitis Assessment & Plan: 2 wk s/p viral uri  Congestion/ r facial pain and tenderness  Pt cannot take augmentin or levaquin or sulfa  Declines prednisone  Disc symptomatic care - see instructions on AVS Will increase flonase  to bid for a week Sent doxycycline  to pharmacy   Update if not starting to improve in a week or if worsening  Call back and Er precautions noted in detail today      Other orders -     Doxycycline  Hyclate; Take  1 tablet (100 mg total) by mouth 2 (two) times daily.  Dispense: 14 tablet; Refill: 0 -     Fluconazole ; Take 1 tablet (150 mg total) by mouth once for 1 dose. For yeast infection  Dispense: 1 tablet; Refill: 0    Follow Up Instructions: No follow-ups on file.  Increase the flonase  to twice daily for a week - then go back to once daily   Use saline nasal spray also  Breathe steam if it helps   Take doxycycline  for sinus infection   Update if not starting to improve in a week or if worsening  Follow up in person if worse or not improving If severe symptoms go to the ER I discussed the assessment and  treatment plan with the patient. The patient was provided an opportunity to ask questions, and all were answered. The patient agreed with the plan and demonstrated an understanding of the instructions.   The patient was advised to call back or seek an in-person evaluation if the symptoms worsen or if the condition fails to improve as anticipated.  The above assessment and management plan was discussed with the patient. The patient verbalized understanding of and has agreed to the management plan.   Laine Balls, MD    [1]  Current Outpatient Medications:    cholecalciferol (VITAMIN D3) 25 MCG (1000 UT) tablet, Take 5,000 Units by mouth daily., Disp: , Rfl:    doxycycline  (VIBRA -TABS) 100 MG tablet, Take 1 tablet (100 mg total) by mouth 2 (two) times daily., Disp: 14 tablet, Rfl: 0   Estradiol  (VAGIFEM ) 10 MCG TABS vaginal tablet, PLACE 1 TABLET VAGINALLY 2 TIMES A WEEK., Disp: 24 tablet, Rfl: 0   estradiol  (VIVELLE -DOT) 0.1 MG/24HR patch, Place 1 patch (0.1 mg total) onto the skin 2 (two) times a week., Disp: 8 patch, Rfl: 12   fluconazole  (DIFLUCAN ) 150 MG tablet, Take 1 tablet (150 mg total) by mouth once for 1 dose. For yeast infection, Disp: 1 tablet, Rfl: 0   fluticasone  (FLONASE ) 50 MCG/ACT nasal spray, PLACE 2 SPRAYS INTO BOTH NOSTRILS DAILY AS NEEDED FOR ALLERGIES OR RHINITIS., Disp: 48 mL, Rfl: 0   ibuprofen (ADVIL) 200 MG tablet, Take 200 mg by mouth every 6 (six) hours as needed., Disp: , Rfl:    NONFORMULARY OR COMPOUNDED ITEM, Hydrogen Water, Body, Collagen, Disp: , Rfl:    progesterone  (PROMETRIUM ) 100 MG capsule, Take 1 capsule (100 mg total) by mouth daily., Disp: 90 capsule, Rfl: 4  Current Facility-Administered Medications:    denosumab  (PROLIA ) injection 60 mg, 60 mg, Subcutaneous, Once, Chrzanowski, Jami B, NP  "

## 2024-08-07 NOTE — Patient Instructions (Addendum)
 Increase the flonase  to twice daily for a week - then go back to once daily   Use saline nasal spray also  Breathe steam if it helps   Take doxycycline  for sinus infection   Update if not starting to improve in a week or if worsening  Follow up in person if worse or not improving If severe symptoms go to the ER

## 2024-08-07 NOTE — Assessment & Plan Note (Signed)
 2 wk s/p viral uri  Congestion/ r facial pain and tenderness  Pt cannot take augmentin or levaquin or sulfa  Declines prednisone  Disc symptomatic care - see instructions on AVS Will increase flonase  to bid for a week Sent doxycycline  to pharmacy   Update if not starting to improve in a week or if worsening  Call back and Er precautions noted in detail today

## 2024-08-13 ENCOUNTER — Other Ambulatory Visit: Payer: Self-pay | Admitting: Family Medicine

## 2024-08-13 ENCOUNTER — Telehealth: Payer: Self-pay | Admitting: *Deleted

## 2024-08-13 DIAGNOSIS — E559 Vitamin D deficiency, unspecified: Secondary | ICD-10-CM

## 2024-08-13 DIAGNOSIS — Z0000 Encounter for general adult medical examination without abnormal findings: Secondary | ICD-10-CM

## 2024-08-13 DIAGNOSIS — E538 Deficiency of other specified B group vitamins: Secondary | ICD-10-CM

## 2024-08-13 DIAGNOSIS — M810 Age-related osteoporosis without current pathological fracture: Secondary | ICD-10-CM

## 2024-08-13 DIAGNOSIS — E7800 Pure hypercholesterolemia, unspecified: Secondary | ICD-10-CM

## 2024-08-13 NOTE — Telephone Encounter (Signed)
 Those labs look great, thanks  Please forward up front, I don't know how

## 2024-08-13 NOTE — Telephone Encounter (Signed)
 Please call and schedule fasting lab appointment prior to her CPE on 08/20/2024.  Future lab orders in EPIC.

## 2024-08-13 NOTE — Telephone Encounter (Signed)
 I placed future lab orders in Epic.  Please review to make sure I ordered everything you want.  Then forward message up front to admin team to schedule fasting lab appointment prior to her CPE with you on 08/20/2024.

## 2024-08-13 NOTE — Telephone Encounter (Signed)
 Copied from CRM [id number]. Topic: Clinical - Request for Lab/Test Order >> Aug 13, 2024  9:02 AM Rea BROCKS wrote: Reason for CRM: Patient called in and stated that she has a physical for next week. She requested to have blood work done and there are no labs in chart.   [id number] (M)

## 2024-08-14 ENCOUNTER — Other Ambulatory Visit (INDEPENDENT_AMBULATORY_CARE_PROVIDER_SITE_OTHER)

## 2024-08-14 DIAGNOSIS — E559 Vitamin D deficiency, unspecified: Secondary | ICD-10-CM

## 2024-08-14 DIAGNOSIS — Z0000 Encounter for general adult medical examination without abnormal findings: Secondary | ICD-10-CM

## 2024-08-14 DIAGNOSIS — E7800 Pure hypercholesterolemia, unspecified: Secondary | ICD-10-CM

## 2024-08-14 DIAGNOSIS — M810 Age-related osteoporosis without current pathological fracture: Secondary | ICD-10-CM

## 2024-08-14 DIAGNOSIS — E538 Deficiency of other specified B group vitamins: Secondary | ICD-10-CM

## 2024-08-14 NOTE — Addendum Note (Signed)
 Addended by: ISADORA RAISIN on: 08/14/2024 08:40 AM   Modules accepted: Orders

## 2024-08-15 ENCOUNTER — Ambulatory Visit: Payer: Self-pay | Admitting: Family Medicine

## 2024-08-15 LAB — CBC WITH DIFFERENTIAL/PLATELET
Basophils Absolute: 0.1 x10E3/uL (ref 0.0–0.2)
Basos: 1 %
EOS (ABSOLUTE): 0.1 x10E3/uL (ref 0.0–0.4)
Eos: 3 %
Hematocrit: 40.8 % (ref 34.0–46.6)
Hemoglobin: 13.4 g/dL (ref 11.1–15.9)
Immature Grans (Abs): 0 x10E3/uL (ref 0.0–0.1)
Immature Granulocytes: 0 %
Lymphocytes Absolute: 1.8 x10E3/uL (ref 0.7–3.1)
Lymphs: 36 %
MCH: 31.9 pg (ref 26.6–33.0)
MCHC: 32.8 g/dL (ref 31.5–35.7)
MCV: 97 fL (ref 79–97)
Monocytes Absolute: 0.4 x10E3/uL (ref 0.1–0.9)
Monocytes: 9 %
Neutrophils Absolute: 2.5 x10E3/uL (ref 1.4–7.0)
Neutrophils: 51 %
Platelets: 247 x10E3/uL (ref 150–450)
RBC: 4.2 x10E6/uL (ref 3.77–5.28)
RDW: 12.4 % (ref 11.7–15.4)
WBC: 4.8 x10E3/uL (ref 3.4–10.8)

## 2024-08-15 LAB — COMPREHENSIVE METABOLIC PANEL WITH GFR
ALT: 11 IU/L (ref 0–32)
AST: 15 IU/L (ref 0–40)
Albumin: 4.1 g/dL (ref 3.9–4.9)
Alkaline Phosphatase: 46 IU/L — ABNORMAL LOW (ref 49–135)
BUN/Creatinine Ratio: 30 — ABNORMAL HIGH (ref 12–28)
BUN: 18 mg/dL (ref 8–27)
Bilirubin Total: 0.4 mg/dL (ref 0.0–1.2)
CO2: 24 mmol/L (ref 20–29)
Calcium: 9.3 mg/dL (ref 8.7–10.3)
Chloride: 102 mmol/L (ref 96–106)
Creatinine, Ser: 0.6 mg/dL (ref 0.57–1.00)
Globulin, Total: 2.3 g/dL (ref 1.5–4.5)
Glucose: 88 mg/dL (ref 70–99)
Potassium: 4.3 mmol/L (ref 3.5–5.2)
Sodium: 143 mmol/L (ref 134–144)
Total Protein: 6.4 g/dL (ref 6.0–8.5)
eGFR: 102 mL/min/1.73

## 2024-08-15 LAB — VITAMIN D 25 HYDROXY (VIT D DEFICIENCY, FRACTURES): Vit D, 25-Hydroxy: 40.2 ng/mL (ref 30.0–100.0)

## 2024-08-15 LAB — LIPID PANEL
Chol/HDL Ratio: 2.9 ratio (ref 0.0–4.4)
Cholesterol, Total: 205 mg/dL — ABNORMAL HIGH (ref 100–199)
HDL: 70 mg/dL
LDL Chol Calc (NIH): 122 mg/dL — ABNORMAL HIGH (ref 0–99)
Triglycerides: 74 mg/dL (ref 0–149)
VLDL Cholesterol Cal: 13 mg/dL (ref 5–40)

## 2024-08-15 LAB — VITAMIN B12: Vitamin B-12: 665 pg/mL (ref 232–1245)

## 2024-08-15 LAB — TSH: TSH: 1.73 u[IU]/mL (ref 0.450–4.500)

## 2024-08-20 ENCOUNTER — Encounter: Payer: Self-pay | Admitting: Family Medicine

## 2024-08-20 ENCOUNTER — Ambulatory Visit (INDEPENDENT_AMBULATORY_CARE_PROVIDER_SITE_OTHER): Admitting: Family Medicine

## 2024-08-20 VITALS — BP 124/64 | HR 66 | Temp 98.1°F | Ht 62.25 in | Wt 137.4 lb

## 2024-08-20 DIAGNOSIS — E559 Vitamin D deficiency, unspecified: Secondary | ICD-10-CM

## 2024-08-20 DIAGNOSIS — M810 Age-related osteoporosis without current pathological fracture: Secondary | ICD-10-CM

## 2024-08-20 DIAGNOSIS — Z0000 Encounter for general adult medical examination without abnormal findings: Secondary | ICD-10-CM

## 2024-08-20 DIAGNOSIS — E538 Deficiency of other specified B group vitamins: Secondary | ICD-10-CM

## 2024-08-20 DIAGNOSIS — E7800 Pure hypercholesterolemia, unspecified: Secondary | ICD-10-CM | POA: Diagnosis not present

## 2024-08-20 DIAGNOSIS — G44019 Episodic cluster headache, not intractable: Secondary | ICD-10-CM

## 2024-08-20 DIAGNOSIS — Z1211 Encounter for screening for malignant neoplasm of colon: Secondary | ICD-10-CM | POA: Insufficient documentation

## 2024-08-20 DIAGNOSIS — Z85820 Personal history of malignant melanoma of skin: Secondary | ICD-10-CM

## 2024-08-20 DIAGNOSIS — Z23 Encounter for immunization: Secondary | ICD-10-CM | POA: Diagnosis not present

## 2024-08-20 DIAGNOSIS — M778 Other enthesopathies, not elsewhere classified: Secondary | ICD-10-CM | POA: Insufficient documentation

## 2024-08-20 NOTE — Assessment & Plan Note (Signed)
 Keeps up with yearly derm care   Uses sun protection or avoids sun

## 2024-08-20 NOTE — Assessment & Plan Note (Signed)
 Reviewed health habits including diet and exercise and skin cancer prevention Reviewed appropriate screening tests for age  Also reviewed health mt list, fam hx and immunization status , as well as social and family history   See HPI Labs reviewed and ordered Health Maintenance  Topic Date Due   Pneumococcal Vaccine for age over 52 (1 of 1 - PCV) Never done   Flu Shot  11/05/2024*   DTaP/Tdap/Td vaccine (4 - Td or Tdap) 08/20/2025*   Zoster (Shingles) Vaccine (1 of 2) 11/18/2025*   COVID-19 Vaccine (4 - 2025-26 season) 07/10/2026*   Colon Cancer Screening  12/28/2024   Breast Cancer Screening  06/10/2025   Osteoporosis screening with Bone Density Scan  08/15/2025   Pap with HPV screening  10/17/2025   Hepatitis C Screening  Completed   HIV Screening  Completed   Hepatitis B Vaccine  Aged Out   HPV Vaccine  Aged Out   Meningitis B Vaccine  Aged Out  *Topic was postponed. The date shown is not the original due date.    Flu shot today  Referral for colonoscopy /screening due in may Declines tetanus vaccine  Declines shingrix vaccine   For gyn follow up in spring /on HRT  Discussed fall prevention, supplements and exercise for bone density  PHQ 0
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
## 2024-08-20 NOTE — Assessment & Plan Note (Signed)
 Lab Results  Component Value Date   VITAMINB12 665 08/14/2024   Normal since stopping ppi No supplement needed

## 2024-08-20 NOTE — Telephone Encounter (Signed)
 We received a pair of orthotics from [facility]. I spoke to [facility] and she stopped by the office to see if the changes that Dr. Magdalen mentioned in his note from 05/30/2024 was completed. No changes were made to the new orthotics. I told Kordelia that I will speak to Lolita when she gets back to get this corrected. I will call her once I have more information.

## 2024-08-20 NOTE — Assessment & Plan Note (Signed)
 Last vitamin D  Lab Results  Component Value Date   VD25OH 40.2 08/14/2024   Encouraged to be compliant with D3 5000 international units daily  In setting of osteoporosis

## 2024-08-20 NOTE — Patient Instructions (Addendum)
 Start some strength training  Add some strength training to your routine, this is important for bone and brain health and can reduce your risk of falls and help your body use insulin properly and regulate weight  Light weights, exercise bands , and internet videos are a good way to start  Yoga (chair or regular), machines , floor exercises or a gym with machines are also good options   Figure out what exercise you can do with your foot  Swimming or pedaling may be better than walking   Flu shot today  For cholesterol Avoid red meat/ fried foods/ egg yolks/ fatty breakfast meats/ butter, cheese and high fat dairy/ and shellfish    Colonoscopy is due may 2026 Call to schedule  [facility]  [phone]  I put the referral in for neurology for your cluster headache  Please let us  know if you don't hear in 1-2 weeks to set that up (mychart message or call or letter)

## 2024-08-20 NOTE — Assessment & Plan Note (Signed)
 This has recurred (more often) since the holidays Right sided  Associated with watery eye  Pt would like to re visit neurology (? If had injections in past) Referral done   Call back and Er precautions noted in detail today

## 2024-08-20 NOTE — Assessment & Plan Note (Signed)
 Disc goals for lipids and reasons to control them Rev last labs with pt Rev low sat fat diet in detail   LDL up a bit Will monitor  Runs in family  Handout given

## 2024-08-20 NOTE — Assessment & Plan Note (Signed)
 Colonoscopy due 12/2024 Referral done  Pt will call to get on schedule

## 2024-08-20 NOTE — Assessment & Plan Note (Signed)
 No falls or fractures  Dexa 08/2023  On HRT Planning to start prolia  with gyn soon  Taking vit D   Discussed fall prevention, supplements and exercise for bone density

## 2024-10-22 ENCOUNTER — Ambulatory Visit: Admitting: Radiology

## 2025-01-06 ENCOUNTER — Ambulatory Visit: Admitting: Neurology
